# Patient Record
Sex: Female | Born: 1960 | Race: Black or African American | Hispanic: No | Marital: Single | Smoking: Never smoker
Health system: Southern US, Community
[De-identification: ages and names within clinical notes are randomized; demographics above are authoritative.]

## PROBLEM LIST (undated history)

## (undated) DIAGNOSIS — M549 Dorsalgia, unspecified: Secondary | ICD-10-CM

## (undated) DIAGNOSIS — E0500 Thyrotoxicosis with diffuse goiter without thyrotoxic crisis or storm: Secondary | ICD-10-CM

## (undated) DIAGNOSIS — M48061 Spinal stenosis, lumbar region without neurogenic claudication: Secondary | ICD-10-CM

## (undated) DIAGNOSIS — Z86718 Personal history of other venous thrombosis and embolism: Secondary | ICD-10-CM

## (undated) DIAGNOSIS — M5030 Other cervical disc degeneration, unspecified cervical region: Secondary | ICD-10-CM

## (undated) DIAGNOSIS — G373 Acute transverse myelitis in demyelinating disease of central nervous system: Secondary | ICD-10-CM

## (undated) HISTORY — DX: Other cervical disc degeneration, unspecified cervical region: M50.30

## (undated) HISTORY — DX: Dorsalgia, unspecified: M54.9

## (undated) HISTORY — DX: Spinal stenosis, lumbar region without neurogenic claudication: M48.061

## (undated) HISTORY — DX: Acute transverse myelitis in demyelinating disease of central nervous system: G37.3

## (undated) HISTORY — DX: Personal history of other venous thrombosis and embolism: Z86.718

## (undated) HISTORY — PX: OTHER SURGICAL HISTORY: SHX169

---

## 2000-11-19 ENCOUNTER — Other Ambulatory Visit: Admission: RE | Admit: 2000-11-19 | Discharge: 2000-11-19 | Payer: Self-pay | Admitting: Obstetrics and Gynecology

## 2011-07-13 ENCOUNTER — Ambulatory Visit
Admission: RE | Admit: 2011-07-13 | Discharge: 2011-07-13 | Disposition: A | Discharge status: Home / Self Care | Payer: 59 | Source: Ambulatory Visit | Attending: Family Medicine | Admitting: Family Medicine

## 2011-07-13 ENCOUNTER — Other Ambulatory Visit: Payer: Self-pay | Admitting: Family Medicine

## 2011-07-13 DIAGNOSIS — R202 Paresthesia of skin: Secondary | ICD-10-CM

## 2011-07-13 DIAGNOSIS — M79609 Pain in unspecified limb: Secondary | ICD-10-CM

## 2012-05-22 DIAGNOSIS — Z86718 Personal history of other venous thrombosis and embolism: Secondary | ICD-10-CM

## 2012-05-22 HISTORY — DX: Personal history of other venous thrombosis and embolism: Z86.718

## 2012-07-31 ENCOUNTER — Encounter (HOSPITAL_COMMUNITY): Payer: Self-pay | Admitting: *Deleted

## 2012-07-31 ENCOUNTER — Emergency Department (HOSPITAL_COMMUNITY): Payer: 59

## 2012-07-31 ENCOUNTER — Inpatient Hospital Stay (HOSPITAL_COMMUNITY)
Admission: EM | Admit: 2012-07-31 | Discharge: 2012-08-06 | DRG: 176 | Disposition: A | Discharge status: Home / Self Care | Payer: 59 | Attending: Internal Medicine | Admitting: Internal Medicine

## 2012-07-31 DIAGNOSIS — E876 Hypokalemia: Secondary | ICD-10-CM | POA: Diagnosis present

## 2012-07-31 DIAGNOSIS — E039 Hypothyroidism, unspecified: Secondary | ICD-10-CM | POA: Diagnosis present

## 2012-07-31 DIAGNOSIS — E0500 Thyrotoxicosis with diffuse goiter without thyrotoxic crisis or storm: Secondary | ICD-10-CM | POA: Diagnosis present

## 2012-07-31 DIAGNOSIS — D649 Anemia, unspecified: Secondary | ICD-10-CM | POA: Diagnosis not present

## 2012-07-31 DIAGNOSIS — I2699 Other pulmonary embolism without acute cor pulmonale: Principal | ICD-10-CM | POA: Diagnosis present

## 2012-07-31 DIAGNOSIS — R079 Chest pain, unspecified: Secondary | ICD-10-CM | POA: Diagnosis present

## 2012-07-31 DIAGNOSIS — Z79899 Other long term (current) drug therapy: Secondary | ICD-10-CM

## 2012-07-31 HISTORY — DX: Thyrotoxicosis with diffuse goiter without thyrotoxic crisis or storm: E05.00

## 2012-07-31 LAB — CBC WITH DIFFERENTIAL/PLATELET
Basophils Absolute: 0 10*3/uL (ref 0.0–0.1)
Eosinophils Relative: 0 % (ref 0–5)
HCT: 36.3 % (ref 36.0–46.0)
Hemoglobin: 12.8 g/dL (ref 12.0–15.0)
Lymphocytes Relative: 10 % — ABNORMAL LOW (ref 12–46)
MCHC: 35.3 g/dL (ref 30.0–36.0)
MCV: 85.4 fL (ref 78.0–100.0)
Monocytes Absolute: 0.8 10*3/uL (ref 0.1–1.0)
Monocytes Relative: 7 % (ref 3–12)
Neutro Abs: 9.2 10*3/uL — ABNORMAL HIGH (ref 1.7–7.7)
RDW: 12.9 % (ref 11.5–15.5)
WBC: 11.1 10*3/uL — ABNORMAL HIGH (ref 4.0–10.5)

## 2012-07-31 LAB — POCT I-STAT TROPONIN I: Troponin i, poc: 0 ng/mL (ref 0.00–0.08)

## 2012-07-31 LAB — COMPREHENSIVE METABOLIC PANEL
BUN: 11 mg/dL (ref 6–23)
CO2: 28 mEq/L (ref 19–32)
Calcium: 9.6 mg/dL (ref 8.4–10.5)
Chloride: 102 mEq/L (ref 96–112)
Creatinine, Ser: 0.61 mg/dL (ref 0.50–1.10)
GFR calc Af Amer: >90 mL/min (ref >90)
GFR calc non Af Amer: >90 mL/min (ref >90)
Total Bilirubin: 0.6 mg/dL (ref 0.3–1.2)

## 2012-07-31 LAB — URINALYSIS, ROUTINE W REFLEX MICROSCOPIC
Nitrite: NEGATIVE
Protein, ur: NEGATIVE mg/dL
Specific Gravity, Urine: 1.02 (ref 1.005–1.030)
Urobilinogen, UA: 0.2 mg/dL (ref 0.0–1.0)

## 2012-07-31 LAB — URINE MICROSCOPIC-ADD ON

## 2012-07-31 MED ORDER — MORPHINE SULFATE 4 MG/ML IJ SOLN
4.0000 mg | INTRAMUSCULAR | Status: DC | PRN
  Administered 2012-07-31 – 2012-08-01 (×2): 4 mg via INTRAVENOUS
  Filled 2012-07-31 (×3): qty 1

## 2012-07-31 MED ORDER — POTASSIUM CHLORIDE CRYS ER 20 MEQ PO TBCR
40.0000 meq | EXTENDED_RELEASE_TABLET | Freq: Once | ORAL | Status: AC
  Administered 2012-07-31: 40 meq via ORAL
  Filled 2012-07-31: qty 2

## 2012-07-31 MED ORDER — IOHEXOL 350 MG/ML SOLN
100.0000 mL | Freq: Once | INTRAVENOUS | Status: AC | PRN
  Administered 2012-07-31: 100 mL via INTRAVENOUS

## 2012-07-31 MED ORDER — ONDANSETRON HCL 4 MG/2ML IJ SOLN
4.0000 mg | Freq: Once | INTRAMUSCULAR | Status: AC
  Administered 2012-07-31: 4 mg via INTRAVENOUS
  Filled 2012-07-31: qty 2

## 2012-07-31 NOTE — ED Notes (Signed)
Pt reports having (R) sided lower back pain today.  States that pain increases with movement and deep inspiration. Pt denies fall or injury.

## 2012-07-31 NOTE — ED Provider Notes (Signed)
Medical screening examination/treatment/procedure(s) were conducted as a shared visit with non-physician practitioner(s) and myself.  I personally evaluated the patient during the encounter.  Patient seen for right sided back pain, chest pain and shortness of breath. Pain is pleuritic in nature. Patient is not hypoxic, but is clearly tachypneic. PE was considered in the differential diagnosis. CT angiography did confirm right-sided PE that explains the patient's symptoms. Patient will be admitted for further management.  Gilda Crease, MD 07/31/12 2337

## 2012-07-31 NOTE — Progress Notes (Signed)
ANTICOAGULATION CONSULT NOTE - Initial Consult  Pharmacy Consult for Heparin Indication: pulmonary embolus  No Known Allergies  Patient Measurements: Height: 5\' 6"  (167.6 cm) Weight: 201 lb (91.173 kg) IBW/kg (Calculated) : 59.3  Vital Signs: Temp: 97.6 F (36.4 C) (03/12 1910) BP: 143/86 mmHg (03/12 2035) Pulse Rate: 91 (03/12 2035)  Labs:  Recent Labs  07/31/12 2108  HGB 12.8  HCT 36.3  PLT 195  CREATININE 0.61    Estimated Creatinine Clearance: 92.6 ml/min (by C-G formula based on Cr of 0.61).   Medical History: Past Medical History  Diagnosis Date  . Graves disease     Medications:  Pepcid  Synthroid  Naproxen  Pen VK  Assessment: 52 yo female with PE for heparin  Goal of Therapy:  Heparin level 0.3-0.7 units/ml Monitor platelets by anticoagulation protocol: Yes   Plan:  Heparin 4000 units IV bolus, then 1300 units/hr Check heparin level in 6 hours.   Eddie Candle 08/01/2012,12:01 AM

## 2012-07-31 NOTE — ED Notes (Addendum)
Pt states that last night she started experiencing pain in her back that's exacerbated with breathing/coughing.  About 3 weeks ago she had a bladder infection and took 2 rounds of antibiotic.

## 2012-07-31 NOTE — ED Provider Notes (Signed)
History    This chart was scribed for non-physician practitioner working with Cynthia Villegas, * by ED Scribe, Burman Nieves. This patient was seen in room TR05C/TR05C and the patient's care was started at 8:22 PM.  CSN: [id number]  Arrival date & time 07/31/12  1858   First MD Initiated Contact with Patient 07/31/12 2022      Chief Complaint  Patient presents with  . Back Pain    (Consider location/radiation/quality/duration/timing/severity/associated sxs/prior treatment) Patient is a 52 y.o. female presenting with back pain. The history is provided by the patient and medical records. No language interpreter was used.  Back Pain Associated symptoms: chest pain   Associated symptoms: no abdominal pain, no dysuria, no fever and no headaches    Cynthia Villegas is a 52 y.o. female brought in by co-worker who presents to the Emergency Department complaining of moderate constant right side flank pain onset today.  Pt was seen for a bladder infection recently and was given antibiotics but denied any dysuria or frequency. The chest pain's onset was 3 days ago shortly after the bladder infection. She reports the chest pain is exacerbated when she breaths. Pt complains that yesterday the chest pain was unbearable.  Pt reports she is shallow breathing to prevent chest pain.Pt was sitting at her desk working today when the right sided flank pain began.  She states that the flank pain and chest pain act simultaneously with one another especially when she breaths. Pt has associated nausea as well with right sided flank pain.  Before today pt states that the pain was a dull sensation.  Pt has not urinated with flank pain. Pt denies any bowel incontinence, fever, chills, cough, vomiting, diarrhea, SOB, weakness, and any other associated symptoms. She denies any medical hx of kidney stones. Reaching down exacerbates lower back pain. Pt denies any fall or injury. X rays or blood work were not taken at her  last visit for treatment of bladder infection.     Past Medical History  Diagnosis Date  . Graves disease     History reviewed. No pertinent past surgical history.  Family History  Problem Relation Age of Onset  . Breast cancer Mother   . Heart disease Father     History  Substance Use Topics  . Smoking status: Never Smoker   . Smokeless tobacco: Not on file  . Alcohol Use: Yes     Comment: occasional    OB History   Grav Para Term Preterm Abortions TAB SAB Ect Mult Living                  Review of Systems  Constitutional: Negative for fever, diaphoresis, appetite change, fatigue and unexpected weight change.  HENT: Negative for mouth sores and neck stiffness.   Eyes: Negative for visual disturbance.  Respiratory: Negative for cough, chest tightness, shortness of breath and wheezing.   Cardiovascular: Positive for chest pain.  Gastrointestinal: Negative for nausea, vomiting, abdominal pain, diarrhea and constipation.  Endocrine: Negative for polydipsia, polyphagia and polyuria.  Genitourinary: Negative for dysuria, urgency, frequency and hematuria.  Musculoskeletal: Positive for myalgias and back pain.  Skin: Negative for rash.  Allergic/Immunologic: Negative for immunocompromised state.  Neurological: Negative for syncope, light-headedness and headaches.  Hematological: Does not bruise/bleed easily.  Psychiatric/Behavioral: Negative for sleep disturbance. The patient is not nervous/anxious.   All other systems reviewed and are negative.    Allergies  Review of patient's allergies indicates no known allergies.  Home Medications  Current Outpatient Rx  Name  Route  Sig  Dispense  Refill  . famotidine-calcium carbonate-magnesium hydroxide (PEPCID COMPLETE) [phone] MG CHEW   Oral   Chew 1 tablet by mouth 2 (two) times daily.         Marland Kitchen levothyroxine (SYNTHROID, LEVOTHROID) 137 MCG tablet   Oral   Take 137 mcg by mouth daily.         . naproxen  (NAPROSYN) 500 MG tablet   Oral   Take 500 mg by mouth 2 (two) times daily with a meal.         . penicillin v potassium (VEETID) 500 MG tablet   Oral   Take 500 mg by mouth 3 (three) times daily. Started 3.3.14 for 7 days         . warfarin (COUMADIN) 5 MG tablet   Oral   Take 1 tablet (5 mg total) by mouth daily.   30 tablet   2     BP 164/89  Pulse 95  Temp(Src) 97.6 F (36.4 C)  Resp 18  SpO2 100%  Physical Exam  Nursing note and vitals reviewed. Constitutional: She is oriented to person, place, and time. She appears well-developed and well-nourished. No distress.  HENT:  Head: Normocephalic and atraumatic.  Mouth/Throat: Oropharynx is clear and moist. No oropharyngeal exudate.  Eyes: Conjunctivae and EOM are normal. Pupils are equal, round, and reactive to light.  Neck: Normal range of motion. Neck supple.  Full ROM without pain  Cardiovascular: Normal rate, regular rhythm, normal heart sounds and intact distal pulses.  Exam reveals no gallop and no friction rub.   No murmur heard. Pt not tachycardic  Pulmonary/Chest: Breath sounds normal. No accessory muscle usage. Tachypnea noted. No respiratory distress. She has no decreased breath sounds. She has no wheezes. She has no rhonchi. She has no rales.   She exhibits tenderness (posterior right ribs, very mild).  Abdominal: Soft. Normal appearance and bowel sounds are normal. She exhibits no distension. There is no tenderness. There is no rebound, no guarding and no CVA tenderness.  Musculoskeletal: Normal range of motion. She exhibits no edema and no tenderness.  Full range of motion of the T-spine and L-spine No tenderness to palpation of the spinous processes of the T-spine or L-spine  Lymphadenopathy:    She has no cervical adenopathy.  Neurological: She is alert and oriented to person, place, and time. She has normal reflexes. She exhibits normal muscle tone. Coordination normal.  Speech is clear and goal  oriented, follows commands Normal strength in upper and lower extremities bilaterally including dorsiflexion and plantar flexion, strong and equal grip strength Sensation normal to light and sharp touch Moves extremities without ataxia, coordination intact Normal gait Normal balance   Skin: Skin is warm and dry. No rash noted. She is not diaphoretic. No erythema.  Psychiatric: She has a normal mood and affect.    ED Course  Procedures (including critical care time) DIAGNOSTIC STUDIES: Oxygen Saturation is 100% on room air , normal by my interpretation.    COORDINATION OF CARE:  8:34 PM Discussed ED treatment with pt and pt agrees.    Labs Reviewed  CBC WITH DIFFERENTIAL - Abnormal; Notable for the following:    WBC 11.1 (*)    Neutrophils Relative 82 (*)    Neutro Abs 9.2 (*)    Lymphocytes Relative 10 (*)    All other components within normal limits  COMPREHENSIVE METABOLIC PANEL - Abnormal; Notable for the following:  Potassium 3.0 (*)    Glucose, Bld 117 (*)    Total Protein 8.5 (*)    AST 58 (*)    ALT 52 (*)    All other components within normal limits  URINALYSIS, ROUTINE W REFLEX MICROSCOPIC - Abnormal; Notable for the following:    APPearance HAZY (*)    Hgb urine dipstick MODERATE (*)    Ketones, ur 40 (*)    Leukocytes, UA SMALL (*)    All other components within normal limits  PROTEIN C, TOTAL - Abnormal; Notable for the following:    Protein C, Total 54 (*)    All other components within normal limits  PROTEIN S ACTIVITY - Abnormal; Notable for the following:    Protein S Activity 51 (*)    All other components within normal limits  PROTEIN S, TOTAL - Abnormal; Notable for the following:    Protein S Ag, Total 57 (*)    All other components within normal limits  CARDIOLIPIN ANTIBODIES, IGG, IGM, IGA - Abnormal; Notable for the following:    Anticardiolipin IgG 9 (*)    Anticardiolipin IgM 0 (*)    Anticardiolipin IgA 6 (*)    All other components  within normal limits  COMPREHENSIVE METABOLIC PANEL - Abnormal; Notable for the following:    Potassium 3.4 (*)    Glucose, Bld 112 (*)    Albumin 3.2 (*)    ALT 38 (*)    All other components within normal limits  CBC - Abnormal; Notable for the following:    RBC 3.75 (*)    Hemoglobin 11.3 (*)    HCT 32.3 (*)    All other components within normal limits  URINALYSIS, ROUTINE W REFLEX MICROSCOPIC - Abnormal; Notable for the following:    Hgb urine dipstick MODERATE (*)    Ketones, ur 15 (*)    Leukocytes, UA SMALL (*)    All other components within normal limits  HEPARIN LEVEL (UNFRACTIONATED) - Abnormal; Notable for the following:    Heparin Unfractionated 0.28 (*)    All other components within normal limits  CBC - Abnormal; Notable for the following:    RBC 3.56 (*)    Hemoglobin 10.6 (*)    HCT 31.1 (*)    All other components within normal limits  BASIC METABOLIC PANEL - Abnormal; Notable for the following:    Potassium 3.3 (*)    All other components within normal limits  URINE MICROSCOPIC-ADD ON - Abnormal; Notable for the following:    Squamous Epithelial / LPF FEW (*)    All other components within normal limits  CBC - Abnormal; Notable for the following:    RBC 3.72 (*)    Hemoglobin 11.2 (*)    HCT 32.2 (*)    All other components within normal limits  PROTIME-INR - Abnormal; Notable for the following:    Prothrombin Time 15.5 (*)    All other components within normal limits  HEPARIN LEVEL (UNFRACTIONATED) - Abnormal; Notable for the following:    Heparin Unfractionated 0.29 (*)    All other components within normal limits  CBC - Abnormal; Notable for the following:    RBC 3.69 (*)    Hemoglobin 11.2 (*)    HCT 31.7 (*)    All other components within normal limits  PROTIME-INR - Abnormal; Notable for the following:    Prothrombin Time 17.5 (*)    All other components within normal limits  CBC - Abnormal; Notable for the following:  HCT 35.5 (*)    All  other components within normal limits  PROTIME-INR - Abnormal; Notable for the following:    Prothrombin Time 18.7 (*)    INR 1.62 (*)    All other components within normal limits  CBC - Abnormal; Notable for the following:    HCT 34.5 (*)    All other components within normal limits  PROTIME-INR - Abnormal; Notable for the following:    Prothrombin Time 23.8 (*)    INR 2.24 (*)    All other components within normal limits  URINE CULTURE  URINE MICROSCOPIC-ADD ON  LIPASE, BLOOD  ANTITHROMBIN III  PROTEIN C ACTIVITY  LUPUS ANTICOAGULANT PANEL  BETA-2-GLYCOPROTEIN I ABS, IGG/M/A  HOMOCYSTEINE  FACTOR 5 LEIDEN  HEPARIN LEVEL (UNFRACTIONATED)  MAGNESIUM  PHOSPHORUS  TSH  PROTIME-INR  HEPARIN LEVEL (UNFRACTIONATED)  PROTIME-INR  HEPARIN LEVEL (UNFRACTIONATED)  HEPARIN LEVEL (UNFRACTIONATED)  BASIC METABOLIC PANEL  TROPONIN I  TROPONIN I  TROPONIN I  HEPARIN LEVEL (UNFRACTIONATED)  HEPARIN LEVEL (UNFRACTIONATED)  HEPARIN LEVEL (UNFRACTIONATED)  POCT I-STAT TROPONIN I   No results found.  ECG:  Date: 08/13/2012  Rate: 110  Rhythm: sinus tachycardia  QRS Axis: left  Intervals: normal  ST/T Wave abnormalities: normal  Conduction Disutrbances:none  Narrative Interpretation: sinus tach without evidence of ischemia; no old for comparison  Old EKG Reviewed: none available    1. Pulmonary embolism   2. Graves disease   3. Hypokalemia   4. Anemia   5. Unspecified hypothyroidism   6. Chest pain       MDM  Cynthia Villegas presents with back and flank pain.  Patient evaluated yesterday for chest pain without blood work or further evaluation and diagnosed with costochondritis. Patient alert, oriented in mild distress in triage. Patient tachypneic, though not tachycardic or hypoxic. Movement makes her pain worse and deep inspiration makes her pain significantly worse. Patient presentation is concerning for possible PE. Will obtain labs and CT angio of chest. I discussed  the patient with Dr. Blinda Leatherwood who will also follow.  ECG now with sinus tach.  CT angiography did confirm right-sided PE that explains the patient's symptoms. Patient will be admitted for further management. I personally reviewed the imaging tests through PACS system.  I reviewed available ER/hospitalization records through the EMR.   I personally performed the services described in this documentation, which was scribed in my presence. The recorded information has been reviewed and is accurate.   Dahlia Client Muthersbaugh, PA-C 08/13/12 [phone]

## 2012-08-01 ENCOUNTER — Encounter (HOSPITAL_COMMUNITY): Payer: Self-pay | Admitting: Internal Medicine

## 2012-08-01 DIAGNOSIS — E039 Hypothyroidism, unspecified: Secondary | ICD-10-CM | POA: Diagnosis present

## 2012-08-01 DIAGNOSIS — D649 Anemia, unspecified: Secondary | ICD-10-CM | POA: Diagnosis present

## 2012-08-01 DIAGNOSIS — E876 Hypokalemia: Secondary | ICD-10-CM | POA: Diagnosis present

## 2012-08-01 DIAGNOSIS — I2699 Other pulmonary embolism without acute cor pulmonale: Secondary | ICD-10-CM

## 2012-08-01 DIAGNOSIS — E0500 Thyrotoxicosis with diffuse goiter without thyrotoxic crisis or storm: Secondary | ICD-10-CM | POA: Diagnosis present

## 2012-08-01 LAB — URINALYSIS, ROUTINE W REFLEX MICROSCOPIC
Bilirubin Urine: NEGATIVE
Ketones, ur: 15 mg/dL — AB
Nitrite: NEGATIVE
pH: 6 (ref 5.0–8.0)

## 2012-08-01 LAB — COMPREHENSIVE METABOLIC PANEL
Alkaline Phosphatase: 80 U/L (ref 39–117)
BUN: 9 mg/dL (ref 6–23)
Calcium: 8.7 mg/dL (ref 8.4–10.5)
GFR calc Af Amer: >90 mL/min (ref >90)
Glucose, Bld: 112 mg/dL — ABNORMAL HIGH (ref 70–99)
Total Protein: 7 g/dL (ref 6.0–8.3)

## 2012-08-01 LAB — ANTITHROMBIN III: AntiThromb III Func: 95 % (ref 75–120)

## 2012-08-01 LAB — BETA-2-GLYCOPROTEIN I ABS, IGG/M/A
Beta-2 Glyco I IgG: 0 G Units (ref <20)
Beta-2-Glycoprotein I IgM: 8 M Units (ref <20)

## 2012-08-01 LAB — CARDIOLIPIN ANTIBODIES, IGG, IGM, IGA: Anticardiolipin IgA: 6 APL U/mL — ABNORMAL LOW (ref <22)

## 2012-08-01 LAB — URINE MICROSCOPIC-ADD ON

## 2012-08-01 LAB — CBC
HCT: 32.3 % — ABNORMAL LOW (ref 36.0–46.0)
Hemoglobin: 11.3 g/dL — ABNORMAL LOW (ref 12.0–15.0)
RDW: 13 % (ref 11.5–15.5)
WBC: 10 10*3/uL (ref 4.0–10.5)

## 2012-08-01 LAB — HOMOCYSTEINE: Homocysteine: 7.8 umol/L (ref 4.0–15.4)

## 2012-08-01 LAB — MAGNESIUM: Magnesium: 1.8 mg/dL (ref 1.5–2.5)

## 2012-08-01 LAB — PHOSPHORUS: Phosphorus: 3.6 mg/dL (ref 2.3–4.6)

## 2012-08-01 LAB — HEPARIN LEVEL (UNFRACTIONATED): Heparin Unfractionated: 0.39 IU/mL (ref 0.30–0.70)

## 2012-08-01 MED ORDER — POTASSIUM CHLORIDE CRYS ER 20 MEQ PO TBCR
40.0000 meq | EXTENDED_RELEASE_TABLET | Freq: Once | ORAL | Status: AC
  Administered 2012-08-01: 40 meq via ORAL
  Filled 2012-08-01: qty 2

## 2012-08-01 MED ORDER — ONDANSETRON HCL 4 MG/2ML IJ SOLN
4.0000 mg | Freq: Four times a day (QID) | INTRAMUSCULAR | Status: DC | PRN
  Administered 2012-08-02: 4 mg via INTRAVENOUS
  Filled 2012-08-01: qty 2

## 2012-08-01 MED ORDER — LEVOTHYROXINE SODIUM 137 MCG PO TABS
137.0000 ug | ORAL_TABLET | Freq: Every day | ORAL | Status: DC
  Administered 2012-08-01 – 2012-08-06 (×6): 137 ug via ORAL
  Filled 2012-08-01 (×8): qty 1

## 2012-08-01 MED ORDER — HYDROCODONE-ACETAMINOPHEN 5-325 MG PO TABS
1.0000 | ORAL_TABLET | ORAL | Status: DC | PRN
  Administered 2012-08-01 (×3): 2 via ORAL
  Administered 2012-08-01: 1 via ORAL
  Administered 2012-08-02 – 2012-08-04 (×6): 2 via ORAL
  Administered 2012-08-04: 1 via ORAL
  Filled 2012-08-01 (×5): qty 2
  Filled 2012-08-01: qty 1
  Filled 2012-08-01: qty 2
  Filled 2012-08-01: qty 1
  Filled 2012-08-01 (×2): qty 2
  Filled 2012-08-01 (×2): qty 1
  Filled 2012-08-01 (×2): qty 2

## 2012-08-01 MED ORDER — SODIUM CHLORIDE 0.9 % IJ SOLN
3.0000 mL | Freq: Two times a day (BID) | INTRAMUSCULAR | Status: DC
  Administered 2012-08-03: 3 mL via INTRAVENOUS

## 2012-08-01 MED ORDER — HEPARIN BOLUS VIA INFUSION
4000.0000 [IU] | Freq: Once | INTRAVENOUS | Status: AC
  Administered 2012-08-01: 4000 [IU] via INTRAVENOUS

## 2012-08-01 MED ORDER — POTASSIUM CHLORIDE CRYS ER 20 MEQ PO TBCR
20.0000 meq | EXTENDED_RELEASE_TABLET | Freq: Once | ORAL | Status: AC
  Administered 2012-08-01: 20 meq via ORAL
  Filled 2012-08-01: qty 1

## 2012-08-01 MED ORDER — ACETAMINOPHEN 650 MG RE SUPP: 650.0000 mg | Freq: Four times a day (QID) | RECTAL | Status: DC | PRN

## 2012-08-01 MED ORDER — LEVOTHYROXINE SODIUM 50 MCG PO TABS
50.0000 ug | ORAL_TABLET | Freq: Every day | ORAL | Status: DC
  Filled 2012-08-01: qty 1

## 2012-08-01 MED ORDER — ONDANSETRON HCL 4 MG PO TABS: 4.0000 mg | ORAL_TABLET | Freq: Four times a day (QID) | ORAL | Status: DC | PRN

## 2012-08-01 MED ORDER — DOCUSATE SODIUM 100 MG PO CAPS
100.0000 mg | ORAL_CAPSULE | Freq: Two times a day (BID) | ORAL | Status: DC
  Administered 2012-08-03 – 2012-08-06 (×7): 100 mg via ORAL
  Filled 2012-08-01 (×12): qty 1

## 2012-08-01 MED ORDER — HEPARIN (PORCINE) IN NACL 100-0.45 UNIT/ML-% IJ SOLN
1500.0000 [IU]/h | INTRAMUSCULAR | Status: DC
  Administered 2012-08-01 (×2): 1300 [IU]/h via INTRAVENOUS
  Administered 2012-08-02 – 2012-08-04 (×3): 1400 [IU]/h via INTRAVENOUS
  Administered 2012-08-04 – 2012-08-06 (×3): 1500 [IU]/h via INTRAVENOUS
  Filled 2012-08-01 (×9): qty 250

## 2012-08-01 MED ORDER — SODIUM CHLORIDE 0.9 % IV SOLN: INTRAVENOUS | Status: AC

## 2012-08-01 MED ORDER — WARFARIN - PHARMACIST DOSING INPATIENT
Freq: Every day | Status: DC
  Administered 2012-08-02: 18:00:00

## 2012-08-01 MED ORDER — COUMADIN BOOK
Freq: Once | Status: AC
  Administered 2012-08-01: 09:00:00
  Filled 2012-08-01: qty 1

## 2012-08-01 MED ORDER — ACETAMINOPHEN 325 MG PO TABS
650.0000 mg | ORAL_TABLET | Freq: Four times a day (QID) | ORAL | Status: DC | PRN
  Administered 2012-08-01: 650 mg via ORAL
  Filled 2012-08-01: qty 2

## 2012-08-01 MED ORDER — WARFARIN SODIUM 7.5 MG PO TABS
7.5000 mg | ORAL_TABLET | Freq: Once | ORAL | Status: AC
  Administered 2012-08-01: 7.5 mg via ORAL
  Filled 2012-08-01: qty 1

## 2012-08-01 MED ORDER — WARFARIN VIDEO
Freq: Once | Status: AC
  Administered 2012-08-01: 09:00:00

## 2012-08-01 NOTE — Progress Notes (Addendum)
[facility] PROGRESS NOTE  Cynthia Villegas WJX:[id number] DOB: 07-Nov-1960 DOA: 07/31/2012 PCP: Cain Saupe, MD  Brief narrative: Addendum to admission note done 08/01/2012 52 year old female with a significant past medical history of Graves' disease and subsequent hypothyroidism who presented to [facility] ED 07/31/12 with chest pain associated with respirations. In ED, evaluation included CT angiogram chest which was significant for acute pulmonary embolism within a right lower lobe segmental branch. Patient was started on Coumadin and heparin. Further evaluation included LE doppler which was negative for DVT.  Assessment/Plan:  Principal Problem:   *PE (pulmonary embolism)  Unclear etiology, hypercoagulable state workup is pending  CT chest angio chest confirmed pulmonary embolism in right lower lobe segmental branch.  LE doppler negative for DVT Active Problems:   Hypokalemia  Repleted  Followup BMP in the morning   Unspecified hypothyroidism  Continue levothyroxine.  Follow up TSH.   Anemia  Hemoglobin was within normal limits on admission and 11.3 today.  Continue to monitor CBC considering patient is on heparin and Coumadin.  Code Status: Full code Family Communication: Family at bedside Disposition Plan: Home when stable  Manson Passey, MD  [facility] Pager [phone]  If 7PM-7AM, please contact night-coverage [email or web address] Password [email or web address] 08/01/2012, 1:22 PM   LOS: 1 day   Consultants:  None   Procedures:  None   Antibiotics:  None   HPI/Subjective: No acute overnight events.  Objective: Filed Vitals:   07/31/12 2324 08/01/12 0030 08/01/12 0145 08/01/12 0548  BP:  136/75 132/73 105/65  Pulse:  96 102 90  Temp:   98 F (36.7 C) 98.1 F (36.7 C)  TempSrc:   Oral Oral  Resp:  22 20 20   Height: 5\' 6"  (1.676 m)     Weight: 91.173 kg (201 lb)  91.1 kg (200 lb 13.4 oz)   SpO2:  100% 94% 96%    Intake/Output Summary (Last 24 hours) at 08/01/12  1322 Last data filed at 08/01/12 0800  Gross per 24 hour  Intake    240 ml  Output      0 ml  Net    240 ml    Exam:   General:  Pt is alert, follows commands appropriately, not in acute distress  Cardiovascular: Regular rate and rhythm, S1/S2, no murmurs, no rubs, no gallops  Respiratory: Clear to auscultation bilaterally, no wheezing, no crackles, no rhonchi  Abdomen: Soft, non tender, non distended, bowel sounds present, no guarding  Extremities: No edema, pulses DP and PT palpable bilaterally  Neuro: Grossly nonfocal  Data Reviewed: Basic Metabolic Panel:  Recent Labs Lab 07/31/12 2108 08/01/12 0620  NA 143 141  K 3.0* 3.4*  CL 102 105  CO2 28 27  GLUCOSE 117* 112*  BUN 11 9  CREATININE 0.61 0.62  CALCIUM 9.6 8.7  MG  --  1.8  PHOS  --  3.6   Liver Function Tests:  Recent Labs Lab 07/31/12 2108 08/01/12 0620  AST 58* 32  ALT 52* 38*  ALKPHOS 94 80  BILITOT 0.6 0.6  PROT 8.5* 7.0  ALBUMIN 4.1 3.2*    Recent Labs Lab 07/31/12 2108  LIPASE 24   No results found for this basename: AMMONIA,  in the last 168 hours CBC:  Recent Labs Lab 07/31/12 2108 08/01/12 0620  WBC 11.1* 10.0  NEUTROABS 9.2*  --   HGB 12.8 11.3*  HCT 36.3 32.3*  MCV 85.4 86.1  PLT 195 166    Studies: Dg Chest 2 View  07/31/2012 IMPRESSION: Low inspiratory volumes with bibasilar atelectasis versus scarring.   Original Report Authenticated By: Malachy Moan, M.D.    Ct Angio Chest Pe W/cm &/or Wo Cm 07/31/2012  *  IMPRESSION: Acute pulmonary embolism within a right lower lobe segmental branch.  Patchy airspace opacities within the lower lobes, middle lobe, and lingula.  May reflect atelectasis or infection.  Infarction not entirely exclude however felt to be less likely.  Critical Value/emergent results were called by telephone at the time of interpretation on 07/31/2012 at 11:15 p.m. to [facility], who verbally acknowledged these results.   Original Report  Authenticated By: Jearld Lesch, M.D.     Scheduled Meds: . docusate sodium  100 mg Oral BID  . sodium chloride  3 mL Intravenous Q12H  . warfarin  7.5 mg Oral ONCE-1800  . Warfarin - Pharmacist Dosing Inpatient   Does not apply q1800   Continuous Infusions: . heparin 1,300 Units/hr (08/01/12 0017)

## 2012-08-01 NOTE — Progress Notes (Signed)
Bilateral:  No evidence of DVT, superficial thrombosis, or Baker's Cyst.   

## 2012-08-01 NOTE — H&P (Signed)
PCP:  Cain Saupe, MD  Endocrine: Talmage Nap  Chief Complaint:   Chest pain  HPI: Cynthia Villegas is a 53 y.o. female   has a past medical history of Graves disease.   Presented with  3 weeks ago she developed nausea and vomiting and pain under both breast which was worse with inspiration. She went to PCP and her urine sowed evidence of UTI. She was started on cipro and then switched to Penicilin K she was told she was not on the right antibiotic.There  is no urine culture results available. She continued to have intermittent chest pain and thought to start on Prilosec without much relief. Today she was at work and developed light-headedness and severe pain with inspiration she came to ER and was diagnosed with subsegmental  PE. Hypercoagulable panel was ordered and she was started on  Heparin.  She deines any leg swelling, no travelling history. She sits at her computer during work day.  Her mother has hx of Breast cancer diagnosed in her 32's and had hx of DVT in the setting of knee surgery. Patient have had a mammogram a year ago but have not had a recent colonoscopy.    Review of Systems:    Pertinent positives include: chest pain pleuretic,  shortness of breath at rest.  Constitutional:  No weight loss, night sweats, Fevers, chills, fatigue, weight loss  HEENT:  No headaches, Difficulty swallowing,Tooth/dental problems,Sore throat,  No sneezing, itching, ear ache, nasal congestion, post nasal drip,  Cardio-vascular:  No, Orthopnea, PND, anasarca, dizziness, palpitations.no Bilateral lower extremity swelling  GI:  No heartburn, indigestion, abdominal pain, nausea, vomiting, diarrhea, change in bowel habits, loss of appetite, melena, blood in stool, hematemesis Resp:  no dyspnea on exertion, No excess mucus, no productive cough, No non-productive cough, No coughing up of blood.No change in color of mucus.No wheezing. Skin:  no rash or lesions. No jaundice GU:  no dysuria, change  in color of urine, no urgency or frequency. No straining to urinate.  No flank pain.  Musculoskeletal:  No joint pain or no joint swelling. No decreased range of motion. No back pain.  Psych:  No change in mood or affect. No depression or anxiety. No memory loss.  Neuro: no localizing neurological complaints, no tingling, no weakness, no double vision, no gait abnormality, no slurred speech, no confusion  Otherwise ROS are negative except for above, 10 systems were reviewed  Past Medical History: Past Medical History  Diagnosis Date  . Graves disease    History reviewed. No pertinent past surgical history.   Medications: Prior to Admission medications   Medication Sig Start Date End Date Taking? Authorizing Provider  famotidine-calcium carbonate-magnesium hydroxide (PEPCID COMPLETE) [phone] MG CHEW Chew 1 tablet by mouth 2 (two) times daily.   Yes Historical Provider, MD  Levothyroxine Sodium (LEVOXYL PO) Take 1 tablet by mouth daily.   Yes Historical Provider, MD  naproxen (NAPROSYN) 500 MG tablet Take 500 mg by mouth 2 (two) times daily with a meal.   Yes Historical Provider, MD  penicillin v potassium (VEETID) 500 MG tablet Take 500 mg by mouth 3 (three) times daily. Started 3.3.14 for 7 days   Yes Historical Provider, MD    Allergies:  No Known Allergies  Social History:  Ambulatory  independently Lives at  Home with dogs   reports that she has never smoked. She does not have any smokeless tobacco history on file. She reports that  drinks alcohol. She reports that she  does not use illicit drugs.   Family History: family history includes Breast cancer in her mother and Heart disease in her father.    Physical Exam: Patient Vitals for the past 24 hrs:  BP Temp Pulse Resp SpO2 Height Weight  08/01/12 0030 136/75 mmHg - 96 22 100 % - -  07/31/12 2324 - - - - - 5\' 6"  (1.676 m) 91.173 kg (201 lb)  07/31/12 2035 143/86 mmHg - 91 14 98 % - -  07/31/12 1910 164/89 mmHg  97.6 F (36.4 C) 95 18 100 % - -    1. General:  in No Acute distress 2. Psychological: Alert and  Oriented 3. Head/ENT:   Moist Mucous Membranes                          Head Non traumatic, neck supple                          Normal  Dentition 4. SKIN: normal  Skin turgor,  Skin clean Dry and intact no rash 5. Heart: Regular rate and rhythm no Murmur, Rub or gallop 6. Lungs: Clear to auscultation bilaterally, no wheezes or crackles   7. Abdomen: Soft, non-tender, Non distended 8. Lower extremities: no clubbing, cyanosis, or edema 9. Neurologically Grossly intact, moving all 4 extremities equally 10. MSK: Normal range of motion  body mass index is 32.46 kg/(m^2).   Labs on Admission:   Recent Labs  07/31/12 2108  NA 143  K 3.0*  CL 102  CO2 28  GLUCOSE 117*  BUN 11  CREATININE 0.61  CALCIUM 9.6    Recent Labs  07/31/12 2108  AST 58*  ALT 52*  ALKPHOS 94  BILITOT 0.6  PROT 8.5*  ALBUMIN 4.1    Recent Labs  07/31/12 2108  LIPASE 24    Recent Labs  07/31/12 2108  WBC 11.1*  NEUTROABS 9.2*  HGB 12.8  HCT 36.3  MCV 85.4  PLT 195   No results found for this basename: CKTOTAL, CKMB, CKMBINDEX, TROPONINI,  in the last 72 hours No results found for this basename: TSH, T4TOTAL, FREET3, T3FREE, THYROIDAB,  in the last 72 hours No results found for this basename: VITAMINB12, FOLATE, FERRITIN, TIBC, IRON, RETICCTPCT,  in the last 72 hours No results found for this basename: HGBA1C    Estimated Creatinine Clearance: 92.6 ml/min (by C-G formula based on Cr of 0.61). ABG No results found for this basename: phart, pco2, po2, hco3, tco2, acidbasedef, o2sat     No results found for this basename: DDIMER     Other results:  I have pearsonaly reviewed this: ECG REPORT  Rate:110  Rhythm: ST ST&T Change: S waves in aVF, III and flipped T waves.   UA mild hematouria  Cultures: No results found for this basename: sdes, specrequest, cult, reptstatus        Radiological Exams on Admission: Dg Chest 2 View  07/31/2012  *RADIOLOGY REPORT*  Clinical Data: Chest pain, back pain  CHEST - 2 VIEW  Comparison: None.  Findings: Bibasilar linear opacities consistent with atelectasis versus scarring.  Cardiac and mediastinal contours within normal limits.  Inspiratory volumes are low.  No edema, pleural effusion or pneumothorax.  No acute osseous abnormality.  IMPRESSION: Low inspiratory volumes with bibasilar atelectasis versus scarring.   Original Report Authenticated By: Malachy Moan, M.D.    Ct Angio Chest Pe W/cm &/or Wo Cm  07/31/2012  *RADIOLOGY REPORT*  Clinical Data: Shortness of breath, chest pain.  CT ANGIOGRAPHY CHEST  Technique:  Multidetector CT imaging of the chest using the standard protocol during bolus administration of intravenous contrast. Multiplanar reconstructed images including MIPs were obtained and reviewed to evaluate the vascular anatomy.  Contrast: OMNIPAQUE IOHEXOL 350 MG/ML SOLN  Comparison: 07/31/2012 radiograph  Findings: There is a pulmonary arterial branch filling defect to the right lower lobe (series 6 image 134).  Normal caliber aorta.  Heart size upper normal.  No pleural or pericardial effusion.  Limited images through the upper abdomen show no acute finding.  Central airways are patent.  There are patchy airspace opacities within the lower lobes, lingula, and middle lobe.  No pneumothorax. Calcifications along the right lower lobe are nonspecific.  No acute osseous finding.  IMPRESSION: Acute pulmonary embolism within a right lower lobe segmental branch.  Patchy airspace opacities within the lower lobes, middle lobe, and lingula.  May reflect atelectasis or infection.  Infarction not entirely exclude however felt to be less likely.  Critical Value/emergent results were called by telephone at the time of interpretation on 07/31/2012 at 11:15 p.m. to [facility], who verbally acknowledged these results.   Original  Report Authenticated By: Jearld Lesch, M.D.     Chart has been reviewed  Assessment/Plan  52 year old female with known known risk factors with new diagnosis of pulmonary embolism.  Present on Admission:  . PE (pulmonary embolism) - monitor on telemetry, give IV fluids, heparin IV and Coumadin per pharmacy, hypercoagulable panel already ordered. We'll order 2-D echo to evaluate for right-sided strain as there are EKG changes. The also obtain Dopplers of low extremity bilaterally to evaluate for clot burden. Patient will need to cancer workup done she'll have a colonoscopy done and have him at the UA should be further pursued. Will repeat UA tomorrow and obtain urine culture.  Luiz Blare disease - at this point is unclear what her dose of thyroid replacement hormone at the time of his discharge he should be continued  . Hypokalemia - we'll replace Questionable history of UTI hold off penicillin for now obtain urine culture.   Prophylaxis: heparin  CODE STATUS: FULL CODE  Other plan as per orders.  I have spent a total of 55 min on this admission  DOUTOVA,ANASTASSIA 08/01/2012, 12:44 AM

## 2012-08-01 NOTE — Progress Notes (Signed)
ANTICOAGULATION CONSULT NOTE - Follow Up Consult  Pharmacy Consult for Heparin/Coumadin Indication: RLL PE  No Known Allergies  Patient Measurements: Height: 5\' 6"  (167.6 cm) Weight: 200 lb 13.4 oz (91.1 kg) IBW/kg (Calculated) : 59.3 Heparin Dosing Weight:    Vital Signs: Temp: 98.1 F (36.7 C) (03/13 0548) Temp src: Oral (03/13 0548) BP: 105/65 mmHg (03/13 0548) Pulse Rate: 90 (03/13 0548)  Labs:  Recent Labs  07/31/12 2108 08/01/12 0620 08/01/12 1240  HGB 12.8 11.3*  --   HCT 36.3 32.3*  --   PLT 195 166  --   LABPROT  --  15.2  --   INR  --  1.22  --   HEPARINUNFRC  --  0.50 0.39  CREATININE 0.61 0.62  --     Estimated Creatinine Clearance: 92.4 ml/min (by C-G formula based on Cr of 0.62).  Assessment: Symptoms x 3 weeks presenting with light-headedness and severe pain on inspiration= segmental PE. Heparin level 0.5 in goal range. Hgb down from 12.8 to 11.3. Plts 166. LE doppler negative.  2nd confirmatory heparin level 0.39 remains in goal range.  Goal of Therapy:  Heparin level 0.3-0.7 units/ml INR 2-3 Monitor platelets by anticoagulation protocol: Yes   Plan:  F/u Hypercoagulable panel  F/u 2D Echo with EKG changes Continue heparin at 1300 units/hr. Next HL in am. Coumadin 7.5mg  po x 1 tonight. Initiate Coumadin education with book/video.   Aidden Markovic S. Merilynn Finland, PharmD, BCPS Clinical Staff Pharmacist Pager [phone]   Misty Stanley Stillinger 08/01/2012,1:56 PM

## 2012-08-01 NOTE — Progress Notes (Signed)
ANTICOAGULATION CONSULT NOTE - Follow Up Consult  Pharmacy Consult for Heparin/Coumadin Indication: RLL PE  No Known Allergies  Patient Measurements: Height: 5\' 6"  (167.6 cm) Weight: 200 lb 13.4 oz (91.1 kg) IBW/kg (Calculated) : 59.3 Heparin Dosing Weight:    Vital Signs: Temp: 98.1 F (36.7 C) (03/13 0548) Temp src: Oral (03/13 0548) BP: 105/65 mmHg (03/13 0548) Pulse Rate: 90 (03/13 0548)  Labs:  Recent Labs  07/31/12 2108 08/01/12 0620  HGB 12.8 11.3*  HCT 36.3 32.3*  PLT 195 166  LABPROT  --  15.2  INR  --  1.22  HEPARINUNFRC  --  0.50  CREATININE 0.61 0.62    Estimated Creatinine Clearance: 92.4 ml/min (by C-G formula based on Cr of 0.62).  Assessment: Symptoms x 3 weeks presenting with light-headedness and severe pain on inspiration= segmental PE. First heparin level 0.5 in goal range. Hgb down from 12.8 to 11.3. Plts 166  Goal of Therapy:  Heparin level 0.3-0.7 units/ml INR 2-3 Monitor platelets by anticoagulation protocol: Yes   Plan:  F/u Hypercoagulable panel  F/u Dopplers of LE F/u 2D Echo with EKG changes Continue heparin at 1300 units/hr and confirm level this pm. Coumadin 7.5mg  po x 1 tonight. Initiate Coumadin education with book/video.   Merilynn Finland, Crystal Stillinger 08/01/2012,8:44 AM

## 2012-08-02 DIAGNOSIS — D649 Anemia, unspecified: Secondary | ICD-10-CM

## 2012-08-02 DIAGNOSIS — I369 Nonrheumatic tricuspid valve disorder, unspecified: Secondary | ICD-10-CM

## 2012-08-02 LAB — HEPARIN LEVEL (UNFRACTIONATED): Heparin Unfractionated: 0.28 IU/mL — ABNORMAL LOW (ref 0.30–0.70)

## 2012-08-02 LAB — LUPUS ANTICOAGULANT PANEL
DRVVT: 35.4 secs (ref <42.9)
Lupus Anticoagulant: NOT DETECTED

## 2012-08-02 LAB — CBC
Hemoglobin: 10.6 g/dL — ABNORMAL LOW (ref 12.0–15.0)
MCH: 29.8 pg (ref 26.0–34.0)
Platelets: 183 10*3/uL (ref 150–400)
RBC: 3.56 MIL/uL — ABNORMAL LOW (ref 3.87–5.11)
WBC: 8.7 10*3/uL (ref 4.0–10.5)

## 2012-08-02 LAB — PROTEIN C ACTIVITY: Protein C Activity: 130 % (ref 75–133)

## 2012-08-02 LAB — PROTEIN C, TOTAL: Protein C, Total: 54 % — ABNORMAL LOW (ref 72–160)

## 2012-08-02 LAB — PROTEIN S ACTIVITY: Protein S Activity: 51 % — ABNORMAL LOW (ref 69–129)

## 2012-08-02 LAB — BASIC METABOLIC PANEL
Chloride: 107 mEq/L (ref 96–112)
GFR calc Af Amer: >90 mL/min (ref >90)
GFR calc non Af Amer: >90 mL/min (ref >90)
Potassium: 3.3 mEq/L — ABNORMAL LOW (ref 3.5–5.1)
Sodium: 142 mEq/L (ref 135–145)

## 2012-08-02 LAB — PROTEIN S, TOTAL: Protein S Ag, Total: 57 % — ABNORMAL LOW (ref 60–150)

## 2012-08-02 LAB — PROTIME-INR
INR: 1.21 (ref 0.00–1.49)
Prothrombin Time: 15.1 seconds (ref 11.6–15.2)

## 2012-08-02 MED ORDER — POTASSIUM CHLORIDE CRYS ER 20 MEQ PO TBCR
40.0000 meq | EXTENDED_RELEASE_TABLET | Freq: Once | ORAL | Status: AC
  Administered 2012-08-02: 40 meq via ORAL
  Filled 2012-08-02: qty 2

## 2012-08-02 MED ORDER — WARFARIN SODIUM 7.5 MG PO TABS
7.5000 mg | ORAL_TABLET | Freq: Once | ORAL | Status: AC
  Administered 2012-08-02: 7.5 mg via ORAL
  Filled 2012-08-02: qty 1

## 2012-08-02 NOTE — Care Management Note (Unsigned)
    Page 1 of 1   08/02/2012     3:47:03 PM   CARE MANAGEMENT NOTE 08/02/2012  Patient:  Cynthia Villegas, Cynthia Villegas   Account Number:  [id number]  Date Initiated:  08/02/2012  Documentation initiated by:  AMERSON,JULIE  Subjective/Objective Assessment:   PT ADM ON 07/31/12 WITH PULMONARY EMBOLUS.  PTA, PT INDEPENDENT.     Action/Plan:   WILL FOLLOW FOR HOME NEEDS.  QUESTION IF PT CAN DC HOME ON [place].   Anticipated DC Date:  08/04/2012   Anticipated DC Plan:  HOME/SELF CARE      DC Planning Services  CM consult      Choice offered to / List presented to:             Status of service:  In process, will continue to follow Medicare Important Message given?   (If response is "NO", the following Medicare IM given date fields will be blank) Date Medicare IM given:   Date Additional Medicare IM given:    Discharge Disposition:    Per UR Regulation:  Reviewed for med. necessity/level of care/duration of stay  If discussed at Long Length of Stay Meetings, dates discussed:    Comments:

## 2012-08-02 NOTE — Progress Notes (Signed)
ANTICOAGULATION CONSULT NOTE - Follow Up Consult  Pharmacy Consult for heparin Indication: pulmonary embolus  Labs:  Recent Labs  07/31/12 2108 08/01/12 0620 08/01/12 1240 08/02/12 0450  HGB 12.8 11.3*  --  10.6*  HCT 36.3 32.3*  --  31.1*  PLT 195 166  --  183  LABPROT  --  15.2  --  15.1  INR  --  1.22  --  1.21  HEPARINUNFRC  --  0.50 0.39 0.28*  CREATININE 0.61 0.62  --   --     Assessment: 52yo female now subtherapeutic on heparin after two levels at goal though had been trending down.  Goal of Therapy:  Heparin level 0.3-0.7 units/ml   Plan:  Will increase heparin gtt by 1 unit/kg/hr to 1400 units/hr and check level in 6hr.  Vernard Gambles, PharmD, BCPS  08/02/2012,5:40 AM

## 2012-08-02 NOTE — Progress Notes (Signed)
ANTICOAGULATION CONSULT NOTE - Follow Up Consult  Pharmacy Consult for Heparin and Coumadin Indication: RLL PE  No Known Allergies  Patient Measurements: Height: 5\' 6"  (167.6 cm) Weight: 200 lb 13.4 oz (91.1 kg) IBW/kg (Calculated) : 59.3 Heparin Dosing Weight: 79kg  Vital Signs: Temp: 98.3 F (36.8 C) (03/14 0620) Temp src: Oral (03/14 0620) BP: 118/70 mmHg (03/14 0620) Pulse Rate: 89 (03/14 0620)  Labs:  Recent Labs  07/31/12 2108  08/01/12 0620 08/01/12 1240 08/02/12 0450 08/02/12 1145  HGB 12.8  --  11.3*  --  10.6*  --   HCT 36.3  --  32.3*  --  31.1*  --   PLT 195  --  166  --  183  --   LABPROT  --   --  15.2  --  15.1  --   INR  --   --  1.22  --  1.21  --   HEPARINUNFRC  --   < > 0.50 0.39 0.28* 0.42  CREATININE 0.61  --  0.62  --  0.60  --   < > = values in this interval not displayed.  Estimated Creatinine Clearance: 92.4 ml/min (by C-G formula based on Cr of 0.6).   Medications:  Heparin @ 1400 units/hr  Assessment: 51yof continues on heparin and coumadin day #2 overlap for PE. Heparin level is therapeutic after rate increase this morning. INR remains subtherapeutic after first dose of coumadin as expected. Hgb/Hct slowly trending down, platelets stable. No bleeding reported.  Goal of Therapy:  Heparin level 0.3-0.7 units/ml INR 2-3 Monitor platelets by anticoagulation protocol: Yes   Plan:  1) Continue heparin at 1400 units/hr 2) Repeat coumadin 7.5mg  x 1 tonight 3) Follow up heparin level, INR, CBC in AM  Fredrik Rigger 08/02/2012,1:17 PM

## 2012-08-02 NOTE — Progress Notes (Signed)
[facility] PROGRESS NOTE  Cynthia Villegas GEX:[id number] DOB: [birth year] DOA: 07/31/2012 PCP: Cain Saupe, MD  Brief narrative: 52 year old female with a significant past medical history of Graves' disease and subsequent hypothyroidism who presented to [facility] ED 07/31/12 with chest pain associated with respirations.  In ED, evaluation included CT angiogram chest which was significant foran acute pulmonary embolism within a right lower lobe segmental branch. Patient was started on Coumadin and heparin. Further evaluation included LE doppler which was negative for DVT. INR is still subtherapeutic.  Assessment/Plan:    Principal Problem:  *PE (pulmonary embolism)  Unclear etiology, hypercoagulable state workup is still pending; cardiolipin normal CT chest angio chest confirmed pulmonary embolism in right lower lobe segmental branch.  LE doppler negative for DVT Continue coumadin and heparin per pharmacy Active Problems:  Hypokalemia  Repleted today as well Followup BMP in the morning Unspecified hypothyroidism  Continue levothyroxine 137 mcg a day Follow up TSH -WNL Anemia  Hemoglobin was within normal limits on admission and 10.6 today.  Follow up CBC in am.   Code Status: Full code  Family Communication: Family at bedside  Disposition Plan: Home when stable   Manson Passey, MD  [facility]  Pager [phone]   Consultants:  None  Procedures:  None  Antibiotics:  None   If 7PM-7AM, please contact night-coverage [email or web address] Password [facility] 08/02/2012, 11:19 AM   LOS: 2 days    HPI/Subjective: No acute overnight events.  Objective: Filed Vitals:   08/01/12 0548 08/01/12 1350 08/01/12 2027 08/02/12 0620  BP: 105/65 108/70 123/66 118/70  Pulse: 90 95 87 89  Temp: 98.1 F (36.7 C) 98.2 F (36.8 C) 98.3 F (36.8 C) 98.3 F (36.8 C)  TempSrc: Oral Oral Oral Oral  Resp: 20 18 18 18   Height:      Weight:      SpO2: 96% 97% 100% 100%    Intake/Output Summary (Last 24  hours) at 08/02/12 1119 Last data filed at 08/02/12 0800  Gross per 24 hour  Intake    480 ml  Output   1100 ml  Net   -620 ml    Exam:   General:  Pt is alert, follows commands appropriately, not in acute distress  Cardiovascular: Regular rate and rhythm, S1/S2, no murmurs, no rubs, no gallops  Respiratory: Clear to auscultation bilaterally, no wheezing, no crackles, no rhonchi  Abdomen: Soft, non tender, non distended, bowel sounds present, no guarding  Extremities: No edema, pulses DP and PT palpable bilaterally  Neuro: Grossly nonfocal  Data Reviewed: Basic Metabolic Panel:  Recent Labs Lab 07/31/12 2108 08/01/12 0620 08/02/12 0450  NA 143 141 142  K 3.0* 3.4* 3.3*  CL 102 105 107  CO2 28 27 25   GLUCOSE 117* 112* 90  BUN 11 9 7   CREATININE 0.61 0.62 0.60  CALCIUM 9.6 8.7 8.7  MG  --  1.8  --   PHOS  --  3.6  --    Liver Function Tests:  Recent Labs Lab 07/31/12 2108 08/01/12 0620  AST 58* 32  ALT 52* 38*  ALKPHOS 94 80  BILITOT 0.6 0.6  PROT 8.5* 7.0  ALBUMIN 4.1 3.2*    Recent Labs Lab 07/31/12 2108  LIPASE 24   No results found for this basename: AMMONIA,  in the last 168 hours CBC:  Recent Labs Lab 07/31/12 2108 08/01/12 0620 08/02/12 0450  WBC 11.1* 10.0 8.7  NEUTROABS 9.2*  --   --   HGB 12.8  11.3* 10.6*  HCT 36.3 32.3* 31.1*  MCV 85.4 86.1 87.4  PLT 195 166 183    Studies: Dg Chest 2 View 07/31/2012   IMPRESSION: Low inspiratory volumes with bibasilar atelectasis versus scarring.   Original Report Authenticated By: Malachy Moan, M.D.    Ct Angio Chest Pe W/cm &/or Wo Cm 07/31/2012  * IMPRESSION: Acute pulmonary embolism within a right lower lobe segmental branch.  Patchy airspace opacities within the lower lobes, middle lobe, and lingula.  May reflect atelectasis or infection.  Infarction not entirely exclude however felt to be less likely.  Critical Value/emergent results were called by telephone at the time of  interpretation on 07/31/2012 at 11:15 p.m. to [facility], who verbally acknowledged these results.   Original Report Authenticated By: Jearld Lesch, M.D.     Scheduled Meds: . docusate sodium  100 mg Oral BID  . levothyroxine  137 mcg Oral QAC breakfast  . potassium chloride  40 mEq Oral Once   Continuous Infusions: . heparin 1,400 Units/hr (08/02/12 8657)

## 2012-08-02 NOTE — Progress Notes (Signed)
  Echocardiogram 2D Echocardiogram has been performed.  Jorje Guild 08/02/2012, 9:14 AM

## 2012-08-03 LAB — BASIC METABOLIC PANEL
BUN: 6 mg/dL (ref 6–23)
CO2: 28 mEq/L (ref 19–32)
Calcium: 8.9 mg/dL (ref 8.4–10.5)
GFR calc non Af Amer: >90 mL/min (ref >90)
Glucose, Bld: 87 mg/dL (ref 70–99)
Sodium: 141 mEq/L (ref 135–145)

## 2012-08-03 LAB — CBC
HCT: 32.2 % — ABNORMAL LOW (ref 36.0–46.0)
MCH: 30.1 pg (ref 26.0–34.0)
MCV: 86.6 fL (ref 78.0–100.0)
Platelets: 206 10*3/uL (ref 150–400)
RDW: 13 % (ref 11.5–15.5)

## 2012-08-03 LAB — URINE CULTURE: Special Requests: NORMAL

## 2012-08-03 MED ORDER — WARFARIN SODIUM 7.5 MG PO TABS
7.5000 mg | ORAL_TABLET | Freq: Once | ORAL | Status: AC
  Administered 2012-08-03: 7.5 mg via ORAL
  Filled 2012-08-03: qty 1

## 2012-08-03 MED ORDER — SODIUM CHLORIDE 0.9 % IV SOLN
INTRAVENOUS | Status: DC
  Administered 2012-08-03 – 2012-08-04 (×2): 10 mL/h via INTRAVENOUS

## 2012-08-03 NOTE — Progress Notes (Signed)
Pt c/o "feeling full" but no abdominal tenderness or discomfort. Pt has active bowl sounds, provided prune juice to assist with BM.   Thane Edu, RN

## 2012-08-03 NOTE — Progress Notes (Signed)
[facility] PROGRESS NOTE  Cynthia ABRAMO ZOX:[id number] DOB: 02-Nov-1960 DOA: 07/31/2012 PCP: Cain Saupe, MD  Brief narrative: 52 year old female with a significant past medical history of Graves' disease and subsequent hypothyroidism who presented to [facility] ED 07/31/12 with chest pain associated with respirations.  In ED, evaluation included CT angiogram chest which was significant foran acute pulmonary embolism within a right lower lobe segmental branch. Patient was started on Coumadin and heparin. Further evaluation included LE doppler which was negative for DVT. INR is still subtherapeutic.   Assessment/Plan:   Principal Problem:  *PE (pulmonary embolism)  Unclear etiology, hypercoagulable state workup is still pending; cardiolipin normal  CT chest angio chest confirmed pulmonary embolism in right lower lobe segmental branch.  LE doppler negative for DVT  Continue coumadin and heparin per pharmacy INR 1.26 today Active Problems:  Hypokalemia  Resolved  Unspecified hypothyroidism  Continue levothyroxine 137 mcg a day  TSH -WNL Anemia  Hemoglobin was within normal limits on admission  Hemoglobin stable at 11.2   Code Status: Full code  Family Communication: Family at bedside  Disposition Plan: Home when stable   Manson Passey, MD  [facility]  Pager [phone]   Consultants:  None  Procedures:  None  Antibiotics:  None     If 7PM-7AM, please contact night-coverage [email or web address] Password [email or web address] 08/03/2012, 6:12 AM   LOS: 3 days   HPI/Subjective: No acute overnight events. Feels better.  Objective: Filed Vitals:   08/02/12 0620 08/02/12 1400 08/02/12 2054 08/03/12 0546  BP: 118/70 110/68 135/72 128/73  Pulse: 89 76 80 85  Temp: 98.3 F (36.8 C) 98.5 F (36.9 C) 98.1 F (36.7 C) 98.7 F (37.1 C)  TempSrc: Oral Oral Oral Oral  Resp: 18 19 18 17   Height:      Weight:      SpO2: 100% 100% 97% 96%    Intake/Output Summary (Last 24 hours) at 08/03/12  0612 Last data filed at 08/03/12 0548  Gross per 24 hour  Intake   1616 ml  Output   2200 ml  Net   -584 ml    Exam:   General:  Pt is alert, follows commands appropriately, not in acute distress  Cardiovascular: Regular rate and rhythm, S1/S2, no murmurs, no rubs, no gallops  Respiratory: Clear to auscultation bilaterally, no wheezing, no crackles, no rhonchi  Abdomen: Soft, non tender, non distended, bowel sounds present, no guarding  Extremities: No edema, pulses DP and PT palpable bilaterally  Neuro: Grossly nonfocal  Data Reviewed: Basic Metabolic Panel:  Recent Labs Lab 07/31/12 2108 08/01/12 0620 08/02/12 0450 08/03/12 0445  NA 143 141 142 141  K 3.0* 3.4* 3.3* 3.5  CL 102 105 107 104  CO2 28 27 25 28   GLUCOSE 117* 112* 90 87  BUN 11 9 7 6   CREATININE 0.61 0.62 0.60 0.59  CALCIUM 9.6 8.7 8.7 8.9  MG  --  1.8  --   --   PHOS  --  3.6  --   --    Liver Function Tests:  Recent Labs Lab 07/31/12 2108 08/01/12 0620  AST 58* 32  ALT 52* 38*  ALKPHOS 94 80  BILITOT 0.6 0.6  PROT 8.5* 7.0  ALBUMIN 4.1 3.2*    Recent Labs Lab 07/31/12 2108  LIPASE 24   No results found for this basename: AMMONIA,  in the last 168 hours CBC:  Recent Labs Lab 07/31/12 2108 08/01/12 0620 08/02/12 0450 08/03/12 0445  WBC 11.1* 10.0  8.7 8.8  NEUTROABS 9.2*  --   --   --   HGB 12.8 11.3* 10.6* 11.2*  HCT 36.3 32.3* 31.1* 32.2*  MCV 85.4 86.1 87.4 86.6  PLT 195 166 183 206   Cardiac Enzymes: No results found for this basename: CKTOTAL, CKMB, CKMBINDEX, TROPONINI,  in the last 168 hours BNP: No components found with this basename: POCBNP,  CBG: No results found for this basename: GLUCAP,  in the last 168 hours  Recent Results (from the past 240 hour(s))  URINE CULTURE     Status: None   Collection Time    08/01/12  4:54 PM      Result Value Range Status   Specimen Description URINE, RANDOM   Final   Special Requests Normal   Final   Culture  Setup Time  08/02/2012 01:43   Final   Colony Count NO GROWTH   Final   Culture NO GROWTH   Final   Report Status 08/03/2012 FINAL   Final     Studies: No results found.  Scheduled Meds: . docusate sodium  100 mg Oral BID  . levothyroxine  137 mcg Oral QAC breakfast  . sodium chloride  3 mL Intravenous Q12H  . Warfarin - Pharmacist Dosing Inpatient   Does not apply q1800   Continuous Infusions: . heparin 1,400 Units/hr (08/02/12 8413)

## 2012-08-03 NOTE — Progress Notes (Signed)
ANTICOAGULATION CONSULT NOTE - Follow Up Consult  Pharmacy Consult for Heparin and Coumadin Indication: RLL PE  No Known Allergies  Patient Measurements: Height: 5\' 6"  (167.6 cm) Weight: 200 lb 13.4 oz (91.1 kg) IBW/kg (Calculated) : 59.3 Heparin Dosing Weight: 79kg  Vital Signs: Temp: 98.7 F (37.1 C) (03/15 0546) Temp src: Oral (03/15 0546) BP: 128/73 mmHg (03/15 0546) Pulse Rate: 85 (03/15 0546)  Labs:  Recent Labs  08/01/12 0620  08/02/12 0450 08/02/12 1145 08/03/12 0445  HGB 11.3*  --  10.6*  --  11.2*  HCT 32.3*  --  31.1*  --  32.2*  PLT 166  --  183  --  206  LABPROT 15.2  --  15.1  --  15.5*  INR 1.22  --  1.21  --  1.25  HEPARINUNFRC 0.50  < > 0.28* 0.42 0.40  CREATININE 0.62  --  0.60  --  0.59  < > = values in this interval not displayed.  Estimated Creatinine Clearance: 92.4 ml/min (by C-G formula based on Cr of 0.59).   Medications:  Heparin @ 1400 units/hr  Assessment: 51yof continues on heparin and coumadin day #3 overlap for PE. Heparin level is therapeutic. INR remains subtherapeutic. Hgb/Hct low but stable, platelets stable. No bleeding reported.  Goal of Therapy:  Heparin level 0.3-0.7 units/ml INR 2-3 Monitor platelets by anticoagulation protocol: Yes   Plan:  1) Continue heparin at 1400 units/hr 2) Repeat coumadin 7.5mg  x 1 tonight 3) Follow up heparin level, INR, CBC in AM  Jacarra Bobak Poteet 08/03/2012,10:03 AM

## 2012-08-04 DIAGNOSIS — R079 Chest pain, unspecified: Secondary | ICD-10-CM

## 2012-08-04 LAB — HEPARIN LEVEL (UNFRACTIONATED): Heparin Unfractionated: 0.29 IU/mL — ABNORMAL LOW (ref 0.30–0.70)

## 2012-08-04 LAB — TROPONIN I
Troponin I: <0.3 ng/mL (ref <0.30)
Troponin I: <0.3 ng/mL (ref <0.30)

## 2012-08-04 LAB — PROTIME-INR
INR: 1.48 (ref 0.00–1.49)
Prothrombin Time: 17.5 seconds — ABNORMAL HIGH (ref 11.6–15.2)

## 2012-08-04 LAB — CBC
Platelets: 238 10*3/uL (ref 150–400)
RBC: 3.69 MIL/uL — ABNORMAL LOW (ref 3.87–5.11)
RDW: 12.9 % (ref 11.5–15.5)
WBC: 7.3 10*3/uL (ref 4.0–10.5)

## 2012-08-04 MED ORDER — NITROGLYCERIN 0.4 MG SL SUBL: 0.4000 mg | SUBLINGUAL_TABLET | SUBLINGUAL | Status: DC | PRN

## 2012-08-04 MED ORDER — WARFARIN SODIUM 7.5 MG PO TABS
7.5000 mg | ORAL_TABLET | Freq: Once | ORAL | Status: AC
  Administered 2012-08-04: 7.5 mg via ORAL
  Filled 2012-08-04: qty 1

## 2012-08-04 NOTE — Progress Notes (Addendum)
ANTICOAGULATION CONSULT NOTE - Follow Up Consult  Pharmacy Consult for Heparin and Coumadin Indication: RLL PE  No Known Allergies  Patient Measurements: Height: 5\' 6"  (167.6 cm) Weight: 200 lb 13.4 oz (91.1 kg) IBW/kg (Calculated) : 59.3 Heparin Dosing Weight: 79kg  Vital Signs: Temp: 98.2 F (36.8 C) (03/16 0433) Temp src: Oral (03/16 0433) BP: 125/72 mmHg (03/16 0433) Pulse Rate: 70 (03/16 0433)  Labs:  Recent Labs  08/02/12 0450 08/02/12 1145 08/03/12 0445 08/04/12 0615 08/04/12 0806  HGB 10.6*  --  11.2* 11.2*  --   HCT 31.1*  --  32.2* 31.7*  --   PLT 183  --  206 238  --   LABPROT 15.1  --  15.5* 17.5*  --   INR 1.21  --  1.25 1.48  --   HEPARINUNFRC 0.28* 0.42 0.40 0.29*  --   CREATININE 0.60  --  0.59  --   --   TROPONINI  --   --   --   --  <0.30    Estimated Creatinine Clearance: 92.4 ml/min (by C-G formula based on Cr of 0.59).   Medications:  Heparin @ 1400 units/hr  Assessment: 51yof continues on heparin and coumadin day #4 overlap for PE. Heparin level is slightly subtherapeutic. INR remains subtherapeutic and trending up. Hgb/Hct low but stable, platelets stable. No bleeding reported.  Goal of Therapy:  Heparin level 0.3-0.7 units/ml INR 2-3 Monitor platelets by anticoagulation protocol: Yes   Plan:  1) Increase heparin to 1500 units/hr 2) Repeat coumadin 7.5mg  x 1 tonight 3)  Recheck heparin level 8 hours after rate increase 4) Follow up heparin level, INR, CBC in AM  Brycen Bean Poteet 08/04/2012,10:01 AM  Addum:  Repeat level therapeutic.  Cont drip at 1500 units/hr.  F.u am labs

## 2012-08-04 NOTE — Progress Notes (Signed)
[facility] PROGRESS NOTE  Cynthia Villegas ZOX:[id number] DOB: 27-Dec-1960 DOA: 07/31/2012 PCP: Cain Saupe, MD  Brief narrative: 52 year old female with a significant past medical history of Graves' disease and subsequent hypothyroidism who presented to [facility] ED 07/31/12 with chest pain associated with respirations.  In ED, evaluation included CT angiogram chest which was significant foran acute pulmonary embolism within a right lower lobe segmental branch. Patient was started on Coumadin and heparin. Further evaluation included LE doppler which was negative for DVT. At this time we are awaiting INR to be at therapeutic level. INR 1.48 today.  Assessment/Plan:   Principal Problem:  *PE (pulmonary embolism)  Unclear etiology, hypercoagulable state workup essentially unremarkable. CT chest angio chest confirmed pulmonary embolism in right lower lobe segmental branch.  LE doppler negative for DVT  Continue coumadin and heparin per pharmacy  INR today 1.48  Active Problems:  Chest pain  Initially presumed ot be secondary to pulmonary embolism. Patient continue to complain of chest pain so we will cycle cardiac enzymes.   2 D ECHO on this admission showed EF of 70%  Nitroglycerin PRN Hypokalemia  Resolved  Unspecified hypothyroidism  Continue levothyroxine 137 mcg a day  TSH -WNL Anemia  Hemoglobin was within normal limits on admission  Hemoglobin stable at 11.2   Code Status: Full code  Family Communication: Family at bedside  Disposition Plan: Home when stable   Manson Passey, MD  [facility]  Pager [phone]   Consultants:  None  Procedures:  None  Antibiotics:  None    If 7PM-7AM, please contact night-coverage [email or web address] Password [email or web address] 08/04/2012, 6:12 AM   LOS: 4 days    HPI/Subjective: No acute overnight events.  Objective: Filed Vitals:   08/03/12 0546 08/03/12 1349 08/03/12 2001 08/04/12 0433  BP: 128/73 109/72 140/71 125/72  Pulse: 85 77 82 70   Temp: 98.7 F (37.1 C) 99.7 F (37.6 C) 99 F (37.2 C) 98.2 F (36.8 C)  TempSrc: Oral Oral Oral Oral  Resp: 17 18 18 18   Height:      Weight:      SpO2: 96% 97% 98% 96%    Intake/Output Summary (Last 24 hours) at 08/04/12 0612 Last data filed at 08/03/12 2108  Gross per 24 hour  Intake      3 ml  Output   1950 ml  Net  -1947 ml    Exam:   General:  Pt is alert, follows commands appropriately, not in acute distress  Cardiovascular: Regular rate and rhythm, S1/S2, no murmurs, no rubs, no gallops  Respiratory: Clear to auscultation bilaterally, no wheezing, no crackles, no rhonchi  Abdomen: Soft, non tender, non distended, bowel sounds present, no guarding  Extremities: No edema, pulses DP and PT palpable bilaterally  Neuro: Grossly nonfocal  Data Reviewed: Basic Metabolic Panel:  Recent Labs Lab 07/31/12 2108 08/01/12 0620 08/02/12 0450 08/03/12 0445  NA 143 141 142 141  K 3.0* 3.4* 3.3* 3.5  CL 102 105 107 104  CO2 28 27 25 28   GLUCOSE 117* 112* 90 87  BUN 11 9 7 6   CREATININE 0.61 0.62 0.60 0.59  CALCIUM 9.6 8.7 8.7 8.9  MG  --  1.8  --   --   PHOS  --  3.6  --   --    Liver Function Tests:  Recent Labs Lab 07/31/12 2108 08/01/12 0620  AST 58* 32  ALT 52* 38*  ALKPHOS 94 80  BILITOT 0.6 0.6  PROT 8.5* 7.0  ALBUMIN 4.1 3.2*    Recent Labs Lab 07/31/12 2108  LIPASE 24   No results found for this basename: AMMONIA,  in the last 168 hours CBC:  Recent Labs Lab 07/31/12 2108 08/01/12 0620 08/02/12 0450 08/03/12 0445  WBC 11.1* 10.0 8.7 8.8  NEUTROABS 9.2*  --   --   --   HGB 12.8 11.3* 10.6* 11.2*  HCT 36.3 32.3* 31.1* 32.2*  MCV 85.4 86.1 87.4 86.6  PLT 195 166 183 206   Cardiac Enzymes: No results found for this basename: CKTOTAL, CKMB, CKMBINDEX, TROPONINI,  in the last 168 hours BNP: No components found with this basename: POCBNP,  CBG: No results found for this basename: GLUCAP,  in the last 168 hours  Recent Results  (from the past 240 hour(s))  URINE CULTURE     Status: None   Collection Time    08/01/12  4:54 PM      Result Value Range Status   Specimen Description URINE, RANDOM   Final   Special Requests Normal   Final   Culture  Setup Time 08/02/2012 01:43   Final   Colony Count NO GROWTH   Final   Culture NO GROWTH   Final   Report Status 08/03/2012 FINAL   Final     Studies: No results found.  Scheduled Meds: . docusate sodium  100 mg Oral BID  . levothyroxine  137 mcg Oral QAC breakfast  . sodium chloride  3 mL Intravenous Q12H  . Warfarin - Pharmacist Dosing Inpatient   Does not apply q1800   Continuous Infusions: . sodium chloride 10 mL/hr (08/03/12 1100)  . heparin 1,400 Units/hr (08/04/12 0317)

## 2012-08-05 DIAGNOSIS — E039 Hypothyroidism, unspecified: Secondary | ICD-10-CM

## 2012-08-05 LAB — HEPARIN LEVEL (UNFRACTIONATED): Heparin Unfractionated: 0.4 IU/mL (ref 0.30–0.70)

## 2012-08-05 LAB — CBC
HCT: 35.5 % — ABNORMAL LOW (ref 36.0–46.0)
Hemoglobin: 12.3 g/dL (ref 12.0–15.0)
MCH: 29.3 pg (ref 26.0–34.0)
MCHC: 34.6 g/dL (ref 30.0–36.0)
MCV: 84.5 fL (ref 78.0–100.0)

## 2012-08-05 LAB — PROTIME-INR: INR: 1.62 — ABNORMAL HIGH (ref 0.00–1.49)

## 2012-08-05 MED ORDER — WARFARIN SODIUM 10 MG PO TABS
10.0000 mg | ORAL_TABLET | Freq: Once | ORAL | Status: AC
  Administered 2012-08-05: 10 mg via ORAL
  Filled 2012-08-05: qty 1

## 2012-08-05 NOTE — Progress Notes (Addendum)
[facility] PROGRESS NOTE  Cynthia Villegas AVW:[id number] DOB: 1961/05/03 DOA: 07/31/2012 PCP: Cain Saupe, MD  Brief narrative: 52 year old female with past medical history of Graves' disease and subsequent hypothyroidism who presented to [facility] ED 07/31/12 with pleuritic chest pains.  In ED, evaluation included CT angiogram chest which was significant for  acute pulmonary embolism within a right lower lobe segmental branch. Patient was started on Coumadin and heparin. Further evaluation included LE doppler which was negative for DVT. At this time we are awaiting INR to be at therapeutic level. INR 1.62 today.  Assessment/Plan:   Principal Problem:  *PE (pulmonary embolism)  Unclear etiology, hypercoagulable state workup essentially unremarkable- except that protein S activity is low- ? Significance. Recommend OP hematology consultation in a couple of months to evaluate for etiology of hypercoagulable state.. CT chest angio chest confirmed pulmonary embolism in right lower lobe segmental branch.  LE doppler negative for DVT  Continue coumadin and heparin per pharmacy  INR today 1.62 Echo: mild LVH. EF 70%. No evidence of marked RV dysfunction.  Active Problems:  Chest pain  Initially presumed ot be secondary to pulmonary embolism. Patient continued to complain of chest pain so cardiac enzymes were cycled and negative.   2 D ECHO on this admission showed EF of 70%  Nitroglycerin PRN\  Chest pain is pleuritic- lower substernal  Hypokalemia  Resolved   Unspecified hypothyroidism  Continue levothyroxine 137 mcg a day  TSH -WNL  Anemia  Hemoglobin was within normal limits on admission  Hemoglobin stable at 11.2   Code Status: Full code  Family Communication: Family at bedside  Disposition Plan: Home when stable & INR therapeutic   Consultants:  None   Procedures:  None   Antibiotics:  None    HPI/Subjective: No acute overnight events. Lower substernal sharp  pain on deep inspiration or cough. Mild DOE.  Objective: Filed Vitals:   08/04/12 0433 08/04/12 1700 08/04/12 2100 08/05/12 0416  BP: 125/72 140/72 128/72 129/71  Pulse: 70 73 74 73  Temp: 98.2 F (36.8 C) 98.7 F (37.1 C) 98.4 F (36.9 C) 98.5 F (36.9 C)  TempSrc: Oral Oral Oral Oral  Resp: 18 16 16 16   Height:      Weight:      SpO2: 96% 98% 98% 99%    Intake/Output Summary (Last 24 hours) at 08/05/12 0755 Last data filed at 08/04/12 2104  Gross per 24 hour  Intake    243 ml  Output   3100 ml  Net  -2857 ml    Exam:   General:  Sitting up eating breakfast. Comfortable.  Cardiovascular: Regular rate and rhythm, S1/S2, no murmurs, no rubs, no gallops. Tele: SR  Respiratory: slightly reduced breath sounds in the bases, but without crackles, wheezing or rhonchi.. No pleural rubs. No increased work of breathing.  Abdomen: Soft, non tender, non distended, bowel sounds present, no guarding  Extremities: No edema, pulses DP and PT palpable bilaterally. 5/5 power symmetrically.  Neuro: Grossly non focal. Alert and oriented  Data Reviewed: Basic Metabolic Panel:  Recent Labs Lab 07/31/12 2108 08/01/12 0620 08/02/12 0450 08/03/12 0445  NA 143 141 142 141  K 3.0* 3.4* 3.3* 3.5  CL 102 105 107 104  CO2 28 27 25 28   GLUCOSE 117* 112* 90 87  BUN 11 9 7 6   CREATININE 0.61 0.62 0.60 0.59  CALCIUM 9.6 8.7 8.7 8.9  MG  --  1.8  --   --  PHOS  --  3.6  --   --    Liver Function Tests:  Recent Labs Lab 07/31/12 2108 08/01/12 0620  AST 58* 32  ALT 52* 38*  ALKPHOS 94 80  BILITOT 0.6 0.6  PROT 8.5* 7.0  ALBUMIN 4.1 3.2*    Recent Labs Lab 07/31/12 2108  LIPASE 24   No results found for this basename: AMMONIA,  in the last 168 hours CBC:  Recent Labs Lab 07/31/12 2108 08/01/12 0620 08/02/12 0450 08/03/12 0445 08/04/12 0615 08/05/12 0552  WBC 11.1* 10.0 8.7 8.8 7.3 7.6  NEUTROABS 9.2*  --   --   --   --   --   HGB 12.8 11.3* 10.6* 11.2* 11.2*  12.3  HCT 36.3 32.3* 31.1* 32.2* 31.7* 35.5*  MCV 85.4 86.1 87.4 86.6 85.9 84.5  PLT 195 166 183 206 238 305   Cardiac Enzymes:  Recent Labs Lab 08/04/12 0806 08/04/12 1310 08/04/12 1830  TROPONINI <0.30 <0.30 <0.30   BNP: No components found with this basename: POCBNP,  CBG: No results found for this basename: GLUCAP,  in the last 168 hours  Recent Results (from the past 240 hour(s))  URINE CULTURE     Status: None   Collection Time    08/01/12  4:54 PM      Result Value Range Status   Specimen Description URINE, RANDOM   Final   Special Requests Normal   Final   Culture  Setup Time 08/02/2012 01:43   Final   Colony Count NO GROWTH   Final   Culture NO GROWTH   Final   Report Status 08/03/2012 FINAL   Final     Studies: No results found.  Scheduled Meds: . docusate sodium  100 mg Oral BID  . levothyroxine  137 mcg Oral QAC breakfast  . sodium chloride  3 mL Intravenous Q12H  . Warfarin - Pharmacist Dosing Inpatient   Does not apply q1800   Continuous Infusions: . sodium chloride 10 mL/hr (08/04/12 1451)  . heparin 1,500 Units/hr (08/04/12 1955)   Additional labs:  Homocysteine: 7.8  Lupus Anticoagulant negative    Fitzhugh Vizcarrondo  Pager: [phone]    If 7PM-7AM, please contact night-coverage  [email or web address]  Password [email or web address]  08/05/2012, 8:11 AM  LOS: 5 days

## 2012-08-05 NOTE — Progress Notes (Signed)
ANTICOAGULATION CONSULT NOTE - Follow Up Consult  Pharmacy Consult for Heparin and Coumadin Indication: RLL PE  No Known Allergies  Patient Measurements: Height: 5\' 6"  (167.6 cm) Weight: 200 lb 13.4 oz (91.1 kg) IBW/kg (Calculated) : 59.3 Heparin Dosing Weight: 79kg  Vital Signs: Temp: 98.5 F (36.9 C) (03/17 0416) Temp src: Oral (03/17 0416) BP: 129/71 mmHg (03/17 0416) Pulse Rate: 73 (03/17 0416)  Labs:  Recent Labs  08/03/12 0445 08/04/12 0615 08/04/12 0806 08/04/12 1310 08/04/12 1830 08/05/12 0552  HGB 11.2* 11.2*  --   --   --  12.3  HCT 32.2* 31.7*  --   --   --  35.5*  PLT 206 238  --   --   --  305  LABPROT 15.5* 17.5*  --   --   --  18.7*  INR 1.25 1.48  --   --   --  1.62*  HEPARINUNFRC 0.40 0.29*  --   --  0.37 0.40  CREATININE 0.59  --   --   --   --   --   TROPONINI  --   --  <0.30 <0.30 <0.30  --     Estimated Creatinine Clearance: 92.4 ml/min (by C-G formula based on Cr of 0.59).   Medications:  Heparin @ 1500 units/hr  Assessment: 51yof continues on heparin and coumadin day #5 overlap for PE. Heparin level is therapeutic. INR remains subtherapeutic and trending up after 4 doses of 7.5 mg coumadin. Hgb/Hct up a bit to 12.3/35.5.  PLTC stable.   No bleeding reported.  Goal of Therapy:  Heparin level 0.3-0.7 units/ml INR 2-3 Monitor platelets by anticoagulation protocol: Yes   Plan:  1)continue heparin at  1500 units/hr 2) coumadin 10 mg po x 1 dose today - will give early 3) consider LMWH 140 mg q24 or 90 mg  q12 if pt ready for DC home 4) Follow up heparin level, INR, CBC in AM Herby Abraham, Pharm.D. [phone] 08/05/2012 10:52 AM

## 2012-08-06 LAB — CBC
HCT: 34.5 % — ABNORMAL LOW (ref 36.0–46.0)
Hemoglobin: 12.2 g/dL (ref 12.0–15.0)
MCH: 30 pg (ref 26.0–34.0)
RBC: 4.06 MIL/uL (ref 3.87–5.11)

## 2012-08-06 LAB — PROTIME-INR
INR: 2.24 — ABNORMAL HIGH (ref 0.00–1.49)
Prothrombin Time: 23.8 seconds — ABNORMAL HIGH (ref 11.6–15.2)

## 2012-08-06 MED ORDER — WARFARIN SODIUM 5 MG PO TABS: 5.0000 mg | ORAL_TABLET | Freq: Every day | ORAL | Status: DC

## 2012-08-06 MED ORDER — WARFARIN SODIUM 7.5 MG PO TABS
7.5000 mg | ORAL_TABLET | Freq: Once | ORAL | Status: DC
  Filled 2012-08-06: qty 1

## 2012-08-06 MED ORDER — ENOXAPARIN SODIUM 100 MG/ML ~~LOC~~ SOLN
1.0000 mg/kg | Freq: Once | SUBCUTANEOUS | Status: AC
  Administered 2012-08-06: 90 mg via SUBCUTANEOUS
  Filled 2012-08-06: qty 1

## 2012-08-06 NOTE — Progress Notes (Signed)
Discharge instructions along with med list and scripts provided. IV d/c'd with catheter intact. Patient transported via wheelchair to lobby for d/c home. Cynthia Villegas

## 2012-08-06 NOTE — Discharge Summary (Signed)
Physician Discharge Summary  Cynthia Villegas WGN:[id number] DOB: 01-Mar-1961 DOA: 07/31/2012  PCP: Cain Saupe, MD  Admit date: 07/31/2012 Discharge date: 08/06/2012  Time spent: 40 minutes  Recommendations for Outpatient Follow-up:  1. Please follow up in the INR clinic as scheduled.   Discharge Diagnoses:  Principal Problem:   PE (pulmonary embolism) Active Problems:   Hypokalemia   Unspecified hypothyroidism   Anemia   Chest pain  Discharge Condition: stable  Diet recommendation: regular  Filed Weights   07/31/12 2324 08/01/12 0145  Weight: 91.173 kg (201 lb) 91.1 kg (200 lb 13.4 oz)   History of present illness:  Cynthia Villegas is a 52 y.o. female has a past medical history of Graves disease. Presented with 3 weeks ago she developed nausea and vomiting and pain under both breast which was worse with inspiration. She went to PCP and her urine sowed evidence of UTI. She was started on cipro and then switched to Penicilin K she was told she was not on the right antibiotic.There is no urine culture results available. She continued to have intermittent chest pain and thought to start on Prilosec without much relief. Today she was at work and developed light-headedness and severe pain with inspiration she came to ER and was diagnosed with subsegmental PE. Hypercoagulable panel was ordered and she was started on Heparin. She deines any leg swelling, no travelling history. She sits at her computer during work day. Her mother has hx of Breast cancer diagnosed in her 74's and had hx of DVT in the setting of knee surgery. Patient have had a mammogram a year ago but have not had a recent colonoscopy.   Hospital Course:  PE (pulmonary embolism)  Unclear etiology, hypercoagulable state workup essentially unremarkable- except that protein S activity is low- ? Significance. Recommend OP hematology consultation in a couple of months to evaluate for etiology of hypercoagulable state. Discussed  this with the patient, and she will likely the hematology referral as an outpatient. CT chest angio chest confirmed pulmonary embolism in right lower lobe segmental branch.  LE doppler negative for DVT  INR within therapeutic range today, discontinue heparin and continue Coumadin alone. Patient is ready for discharge today. Echo: mild LVH. EF 70%. No evidence of marked RV dysfunction. Initially tachycardic, resolved by the time of discharge. Breathing comfortable on room air.  Chest pain  Initially presumed ot be secondary to pulmonary embolism. Patient continued to complain of chest pain so cardiac enzymes were cycled and negative.  2 D ECHO on this admission showed EF of 70%  Nitroglycerin PRN Chest pain is pleuritic- lower substernal Hypokalemia  Resolved  Unspecified hypothyroidism  Continue levothyroxine 137 mcg a day  TSH was normal Anemia  Hemoglobin was within normal limits on admission  Procedures:  2D echo - Left ventricle: Tachycardia during the study. The cavity size was normal. Wall thickness was increased in a pattern of mild LVH. The estimated ejection fraction was 70%. Wall motion was normal; there were no regional wall motion abnormalities. - Right ventricle: The RV is not seen well. However, there does not appear to be marked RV dysfunction. - Pulmonary arteries: PA peak pressure: 37mm Hg (S). - Inferior vena cava: The vessel was dilated; the respirophasic diameter changes were blunted (< 50%); findings are consistent with elevated central venous pressure.  Consultations:  none  Discharge Exam: Filed Vitals:   08/05/12 0416 08/05/12 1444 08/05/12 1926 08/06/12 0509  BP: 129/71 147/88 143/79 124/70  Pulse: 73 80  92 80  Temp: 98.5 F (36.9 C) 99 F (37.2 C) 99.1 F (37.3 C) 98.6 F (37 C)  TempSrc: Oral Oral Oral Oral  Resp: 16 18 16 16   Height:      Weight:      SpO2: 99% 94% 100% 100%   General: NAD, comfortable Cardiovascular: RRR without MRG,  no tachycardia Respiratory: CTA biL, on room ait  Discharge Instructions    Medication List    TAKE these medications       famotidine-calcium carbonate-magnesium hydroxide 10-800-165 MG Chew  Commonly known as:  PEPCID COMPLETE  Chew 1 tablet by mouth 2 (two) times daily.     levothyroxine 137 MCG tablet  Commonly known as:  SYNTHROID, LEVOTHROID  Take 137 mcg by mouth daily.     naproxen 500 MG tablet  Commonly known as:  NAPROSYN  Take 500 mg by mouth 2 (two) times daily with a meal.     penicillin v potassium 500 MG tablet  Commonly known as:  VEETID  Take 500 mg by mouth 3 (three) times daily. Started 3.3.14 for 7 days     warfarin 5 MG tablet  Commonly known as:  COUMADIN  Take 1 tablet (5 mg total) by mouth daily.        The results of significant diagnostics from this hospitalization (including imaging, microbiology, ancillary and laboratory) are listed below for reference.    Significant Diagnostic Studies: Dg Chest 2 View  07/31/2012  *RADIOLOGY REPORT*  Clinical Data: Chest pain, back pain  CHEST - 2 VIEW  Comparison: None.  Findings: Bibasilar linear opacities consistent with atelectasis versus scarring.  Cardiac and mediastinal contours within normal limits.  Inspiratory volumes are low.  No edema, pleural effusion or pneumothorax.  No acute osseous abnormality.  IMPRESSION: Low inspiratory volumes with bibasilar atelectasis versus scarring.   Original Report Authenticated By: Malachy Moan, M.D.    Ct Angio Chest Pe W/cm &/or Wo Cm  07/31/2012  *RADIOLOGY REPORT*  Clinical Data: Shortness of breath, chest pain.  CT ANGIOGRAPHY CHEST  Technique:  Multidetector CT imaging of the chest using the standard protocol during bolus administration of intravenous contrast. Multiplanar reconstructed images including MIPs were obtained and reviewed to evaluate the vascular anatomy.  Contrast: OMNIPAQUE IOHEXOL 350 MG/ML SOLN  Comparison: 07/31/2012 radiograph   Findings: There is a pulmonary arterial branch filling defect to the right lower lobe (series 6 image 134).  Normal caliber aorta.  Heart size upper normal.  No pleural or pericardial effusion.  Limited images through the upper abdomen show no acute finding.  Central airways are patent.  There are patchy airspace opacities within the lower lobes, lingula, and middle lobe.  No pneumothorax. Calcifications along the right lower lobe are nonspecific.  No acute osseous finding.  IMPRESSION: Acute pulmonary embolism within a right lower lobe segmental branch.  Patchy airspace opacities within the lower lobes, middle lobe, and lingula.  May reflect atelectasis or infection.  Infarction not entirely exclude however felt to be less likely.  Critical Value/emergent results were called by telephone at the time of interpretation on 07/31/2012 at 11:15 p.m. to [facility], who verbally acknowledged these results.   Original Report Authenticated By: Jearld Lesch, M.D.     Microbiology: Recent Results (from the past 240 hour(s))  URINE CULTURE     Status: None   Collection Time    08/01/12  4:54 PM      Result Value Range Status   Specimen  Description URINE, RANDOM   Final   Special Requests Normal   Final   Culture  Setup Time 08/02/2012 01:43   Final   Colony Count NO GROWTH   Final   Culture NO GROWTH   Final   Report Status 08/03/2012 FINAL   Final     Labs: Basic Metabolic Panel:  Recent Labs Lab 07/31/12 2108 08/01/12 0620 08/02/12 0450 08/03/12 0445  NA 143 141 142 141  K 3.0* 3.4* 3.3* 3.5  CL 102 105 107 104  CO2 28 27 25 28   GLUCOSE 117* 112* 90 87  BUN 11 9 7 6   CREATININE 0.61 0.62 0.60 0.59  CALCIUM 9.6 8.7 8.7 8.9  MG  --  1.8  --   --   PHOS  --  3.6  --   --    Liver Function Tests:  Recent Labs Lab 07/31/12 2108 08/01/12 0620  AST 58* 32  ALT 52* 38*  ALKPHOS 94 80  BILITOT 0.6 0.6  PROT 8.5* 7.0  ALBUMIN 4.1 3.2*    Recent Labs Lab 07/31/12 2108   LIPASE 24   CBC:  Recent Labs Lab 07/31/12 2108  08/02/12 0450 08/03/12 0445 08/04/12 0615 08/05/12 0552 08/06/12 0410  WBC 11.1*  < > 8.7 8.8 7.3 7.6 8.5  NEUTROABS 9.2*  --   --   --   --   --   --   HGB 12.8  < > 10.6* 11.2* 11.2* 12.3 12.2  HCT 36.3  < > 31.1* 32.2* 31.7* 35.5* 34.5*  MCV 85.4  < > 87.4 86.6 85.9 84.5 85.0  PLT 195  < > 183 206 238 305 306  < > = values in this interval not displayed. Cardiac Enzymes:  Recent Labs Lab 08/04/12 0806 08/04/12 1310 08/04/12 1830  TROPONINI <0.30 <0.30 <0.30     Signed:  Pamella Pert  [facility] 08/06/2012, 9:00 AM

## 2012-08-06 NOTE — Progress Notes (Signed)
ANTICOAGULATION CONSULT NOTE - Follow Up Consult  Pharmacy Consult for Coumadin Indication: RLL PE  No Known Allergies  Patient Measurements: Height: 5\' 6"  (167.6 cm) Weight: 200 lb 13.4 oz (91.1 kg) IBW/kg (Calculated) : 59.3 Heparin Dosing Weight: 79kg  Vital Signs: Temp: 98.6 F (37 C) (03/18 0509) Temp src: Oral (03/18 0509) BP: 124/70 mmHg (03/18 0509) Pulse Rate: 80 (03/18 0509)  Labs:  Recent Labs  08/04/12 0615 08/04/12 0806 08/04/12 1310 08/04/12 1830 08/05/12 0552 08/06/12 0410  HGB 11.2*  --   --   --  12.3 12.2  HCT 31.7*  --   --   --  35.5* 34.5*  PLT 238  --   --   --  305 306  LABPROT 17.5*  --   --   --  18.7* 23.8*  INR 1.48  --   --   --  1.62* 2.24*  HEPARINUNFRC 0.29*  --   --  0.37 0.40 0.56  TROPONINI  --  <0.30 <0.30 <0.30  --   --     Estimated Creatinine Clearance: 92.4 ml/min (by C-G formula based on Cr of 0.59).   Medications:  Heparin d/c'd  Assessment: Cynthia Villegas continues on heparin and coumadin day #6 overlap for PE. Heparin level is therapeutic. INR therapeutic today and transitioning off ufh to lovenox for 12 hours. D/c home today, estimate patient will need 7.5mg  daily.   Goal of Therapy:   INR 2-3 Monitor platelets by anticoagulation protocol: Yes   Plan:  Coumadin 7.5mg  today and f/u daily protime.  Verlene Mayer, PharmD, BCPS Pager [phone]  08/06/2012 9:07 AM

## 2012-08-13 ENCOUNTER — Telehealth: Payer: Self-pay | Admitting: Oncology

## 2012-08-13 NOTE — Telephone Encounter (Signed)
LVOM FOR PT TO RETURN CALL IN RE TO APPT.  °

## 2012-08-14 ENCOUNTER — Telehealth: Payer: Self-pay | Admitting: Oncology

## 2012-08-14 NOTE — Telephone Encounter (Signed)
C/D 08/14/12 for appt.09/10/12

## 2012-08-14 NOTE — Telephone Encounter (Signed)
S/W PT IN RE NP APPT 04/22 @ 10:30 W/DR. SHADAD. REFERRING DR. CAMMIE FULP DX- S/P PE WITH LOW PROTEIN S  WELCOME PACKET MAILED.

## 2012-08-15 NOTE — ED Provider Notes (Signed)
Medical screening examination/treatment/procedure(s) were conducted as a shared visit with non-physician practitioner(s) and myself.  I personally evaluated the patient during the encounter.  Patient evaluated for pleuritic chest pain. Symptoms present for 2 days. Patient indicates that her symptoms are worsening. Examination revealed mild distress. Workup revealed evidence of pulmonary embolism. Patient admitted to the hospital for definitive treatment.  Gilda Crease, MD 08/15/12 [phone]

## 2012-09-04 ENCOUNTER — Telehealth: Payer: Self-pay | Admitting: Oncology

## 2012-09-04 NOTE — Telephone Encounter (Signed)
LVOM FOR PT TO RETURN CALL IN RE TO APPT CHANGE.

## 2012-09-10 ENCOUNTER — Ambulatory Visit (HOSPITAL_BASED_OUTPATIENT_CLINIC_OR_DEPARTMENT_OTHER): Payer: 59 | Admitting: Oncology

## 2012-09-10 ENCOUNTER — Telehealth: Payer: Self-pay | Admitting: Oncology

## 2012-09-10 ENCOUNTER — Ambulatory Visit: Payer: 59

## 2012-09-10 ENCOUNTER — Other Ambulatory Visit: Payer: 59 | Admitting: Lab

## 2012-09-10 ENCOUNTER — Encounter: Payer: Self-pay | Admitting: Oncology

## 2012-09-10 ENCOUNTER — Ambulatory Visit: Payer: 59 | Admitting: Oncology

## 2012-09-10 VITALS — BP 147/86 | HR 81 | Temp 98.2°F | Resp 18 | Ht 66.0 in | Wt 202.0 lb

## 2012-09-10 DIAGNOSIS — I2699 Other pulmonary embolism without acute cor pulmonale: Secondary | ICD-10-CM

## 2012-09-10 DIAGNOSIS — E039 Hypothyroidism, unspecified: Secondary | ICD-10-CM

## 2012-09-10 DIAGNOSIS — Z7901 Long term (current) use of anticoagulants: Secondary | ICD-10-CM

## 2012-09-10 DIAGNOSIS — Z86718 Personal history of other venous thrombosis and embolism: Secondary | ICD-10-CM

## 2012-09-10 NOTE — Progress Notes (Signed)
Note dictated

## 2012-09-10 NOTE — Progress Notes (Signed)
Checked in new pt with no financial concerns. °

## 2012-09-10 NOTE — Telephone Encounter (Signed)
PT ARRIVED TODAY THINK ABOUT WAS TODAY. VM WAS FOR PT ABOUT CHANGE IN APPT ON 04/16 APPT HAS BEEN R/S TODAY DUE TO AN OPEN NP SLOT.

## 2012-09-11 NOTE — Progress Notes (Signed)
CC:   Cammie Fulp, MD  REASON FOR CONSULTATION:  Pulmonary embolus.  HISTORY OF PRESENT ILLNESS:  Ms. Riordan is a pleasant 52 year old woman with a past medical history significant for hyperthyroidism diagnosed close to 10 years ago, and she has had a thyroid ablation and currently hypothyroid on a levothyroxine supplementation.  The patient was in her normal state of health, where she presented acutely with chest pain in March 2014.  She initially had nausea and vomiting and pain under her breast, and treated with a urinary tract infection initially.  The pain persisted and subsequently she presented to the hospital with these complaints.  She underwent a CT scan on 07/31/2012 which showed an acute pulmonary embolism within the right lower lobe segmental branch and ended up being hospitalized between 07/31/2012 and 08/06/2012.  At that time she was discharged home on warfarin with a therapeutic INR.  During her hospitalization she did have a hypercoagulable workup that was done and showed that she has really no anticardiolipin antibodies detected. Her lupus anticoagulant was not detected.  She had a normal factor V Leiden mutation.  Prothrombin gene mutation and antithrombin 3 levels were normal.  Beta-2 glycoprotein all normal.  Her protein C total activity was slightly low at 54, so is her protein S at 57; but that was drawn in the acute clot setting.  The patient is stabilized on Coumadin levels and has felt well since her discharge.  She resumed her activities of daily living and had not had any hospitalization or illnesses.  Leading up to this episode she denied any prolonged immobilization.  She had not had any orthopedic surgery.  She had not had any estrogen replacement therapy.  She did not have any particular trauma.  The only provoking symptoms or symptoms that possibly could have contributed are her immobility at work, where she sits about 6 hours straight at her desk.   Since her discharge she has tried to be changing that.  Other than that, she had not had any long travel, has not had particularly any trauma.  REVIEW OF SYSTEMS:  Does not report any headaches, blurry vision, double vision.  Does not report any motor or sensory neuropathy.  Does not report any  alteration in mental status.  Does not report any psychiatric issues or depression.  Does not report any fever, chills, sweats.  Does not report any cough, hemoptysis, hematemesis.  No nausea, vomiting, abdominal pain, hematochezia, melena, or genitourinary complaints.  Rest of the review of systems is unremarkable.  PAST MEDICAL HISTORY:  Really unremarkable.  She is status post hysterectomy.  She has a history of Graves disease, and currently hypothyroidism.  MEDICATIONS:  She is on Synthroid.  She is on warfarin, Claritin, and Protonix.  ALLERGIES:  None.  SOCIAL HISTORY:  She is single, lives alone.  She has 1 daughter. Denied any alcohol or tobacco abuse.  She has not had any history of miscarriages.  FAMILY HISTORY:  Mother had breast cancer.  She also had a postoperative DVT.  Father has glaucoma.  She has 2 sisters; 1 had a miscarriage, but no other blood clots.  PHYSICAL EXAMINATION:  General:  Alert, awake, pleasant woman, appeared in no active distress.  Vital Signs:  Her blood pressure is 147/86, pulse 81, respiration 18, temperature is 98.  She weighs 202 pounds. ECOG performance status 0.  HEENT:  Head is normocephalic, atraumatic. Pupils equal, round, reactive to light.  Oral mucosa moist and pink. Neck:  Supple,  without lymphadenopathy.  Heart:  Regular rate and rhythm.  S1 and S2.  Lungs:  Clear to auscultation.  No crackle, wheeze, or dullness to percussion.  Abdomen:  Soft, nontender.  No hepatosplenomegaly.  Extremities:  No clubbing, cyanosis, or edema. Neurologic:  Intact motor, sensory, and deep tendon reflexes.  LABORATORY DATA:  Discussed in the history of  present illness.  ASSESSMENT AND PLAN:  This is a 52 year old woman with a recent diagnosis of a pulmonary embolus.  It appears to be unprovoked.  She did have a history of mild obesity as well as immobilization at her work. 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  She has really no other provoking symptoms at this time.  Her hypercoagulable workup was really unremarkable.  Her low protein S and protein C are very mild and are probably related to the acute clot setting.  I doubt this is an inherited thrombophilia.  Her family history is really also unremarkable despite a history of DVT in her mother, which is clearly postoperative after a knee operation.  At this point I do not think there is an occult malignancy.  At this point she has had a CT scan, as well as she is up to speed on her cancer screening.  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  After 6 months I think it will be reasonable to consider coming off anticoagulation, and probably a low-dose aspirin 81 mg for preventative purposes would be reasonable as well.  However, if she develops another blood clot, then probably lifetime anticoagulation would be warranted.  I also counseled her about continued age-appropriate cancer screening as well as the importance of weight __________gain, active hydration, and continuing to be active and try to limit her immobility times for at least less than 2 hours, and stretching exercises to try to prevent any stagnant blood flow in her lower extremities.  I have not scheduled Ms. Therrell a routine followup, but I will be happy to see her in the future as needed.    ______________________________ Benjiman Core, M.D. FNS/MEDQ  D:  09/10/2012  T:  09/11/2012  Job:  [id number]

## 2012-10-02 ENCOUNTER — Ambulatory Visit: Payer: 59

## 2012-10-02 ENCOUNTER — Ambulatory Visit: Payer: 59 | Admitting: Oncology

## 2012-10-02 ENCOUNTER — Other Ambulatory Visit: Payer: 59 | Admitting: Lab

## 2017-12-09 ENCOUNTER — Emergency Department (HOSPITAL_COMMUNITY)
Admission: EM | Admit: 2017-12-09 | Discharge: 2017-12-09 | Disposition: A | Discharge status: Home / Self Care | Payer: 59 | Attending: Emergency Medicine | Admitting: Emergency Medicine

## 2017-12-09 ENCOUNTER — Encounter (HOSPITAL_COMMUNITY): Payer: Self-pay | Admitting: Emergency Medicine

## 2017-12-09 ENCOUNTER — Emergency Department (HOSPITAL_COMMUNITY): Payer: 59

## 2017-12-09 DIAGNOSIS — Z79899 Other long term (current) drug therapy: Secondary | ICD-10-CM | POA: Diagnosis not present

## 2017-12-09 DIAGNOSIS — E039 Hypothyroidism, unspecified: Secondary | ICD-10-CM | POA: Diagnosis not present

## 2017-12-09 DIAGNOSIS — Z7982 Long term (current) use of aspirin: Secondary | ICD-10-CM | POA: Insufficient documentation

## 2017-12-09 DIAGNOSIS — R200 Anesthesia of skin: Secondary | ICD-10-CM | POA: Diagnosis not present

## 2017-12-09 DIAGNOSIS — E876 Hypokalemia: Secondary | ICD-10-CM | POA: Diagnosis not present

## 2017-12-09 LAB — URINALYSIS, ROUTINE W REFLEX MICROSCOPIC
BILIRUBIN URINE: NEGATIVE
Glucose, UA: NEGATIVE mg/dL
KETONES UR: NEGATIVE mg/dL
NITRITE: NEGATIVE
PH: 7 (ref 5.0–8.0)
Protein, ur: NEGATIVE mg/dL
SPECIFIC GRAVITY, URINE: 1.014 (ref 1.005–1.030)

## 2017-12-09 LAB — CBC
HEMATOCRIT: 39.8 % (ref 36.0–46.0)
Hemoglobin: 13.4 g/dL (ref 12.0–15.0)
MCH: 29.8 pg (ref 26.0–34.0)
MCHC: 33.7 g/dL (ref 30.0–36.0)
MCV: 88.6 fL (ref 78.0–100.0)
Platelets: 222 10*3/uL (ref 150–400)
RBC: 4.49 MIL/uL (ref 3.87–5.11)
RDW: 12.8 % (ref 11.5–15.5)
WBC: 8.7 10*3/uL (ref 4.0–10.5)

## 2017-12-09 LAB — VITAMIN B12: VITAMIN B 12: 447 pg/mL (ref 180–914)

## 2017-12-09 LAB — BASIC METABOLIC PANEL
Anion gap: 8 (ref 5–15)
BUN: 9 mg/dL (ref 6–20)
CALCIUM: 9 mg/dL (ref 8.9–10.3)
CO2: 31 mmol/L (ref 22–32)
Chloride: 103 mmol/L (ref 98–111)
Creatinine, Ser: 0.78 mg/dL (ref 0.44–1.00)
GFR calc Af Amer: >60 mL/min (ref >60)
Glucose, Bld: 128 mg/dL — ABNORMAL HIGH (ref 70–99)
POTASSIUM: 3.1 mmol/L — AB (ref 3.5–5.1)
SODIUM: 142 mmol/L (ref 135–145)

## 2017-12-09 LAB — TSH: TSH: 2.364 u[IU]/mL (ref 0.350–4.500)

## 2017-12-09 MED ORDER — ONDANSETRON 4 MG PO TBDP
4.0000 mg | ORAL_TABLET | Freq: Once | ORAL | Status: DC | PRN
  Filled 2017-12-09: qty 1

## 2017-12-09 MED ORDER — POTASSIUM CHLORIDE CRYS ER 20 MEQ PO TBCR
40.0000 meq | EXTENDED_RELEASE_TABLET | Freq: Once | ORAL | Status: AC
  Administered 2017-12-09: 40 meq via ORAL
  Filled 2017-12-09: qty 2

## 2017-12-09 NOTE — ED Notes (Signed)
Pt ambulatory to restroom with steady gait.

## 2017-12-09 NOTE — ED Notes (Signed)
Pt updated on plan of care

## 2017-12-09 NOTE — ED Notes (Signed)
Patient transported to MRI 

## 2017-12-09 NOTE — ED Notes (Addendum)
Pt reports numbness in her legs bilaterally. Denies any "weakness" but states her sensation is different. Strength in legs is good bilaterally. She is aox4.

## 2017-12-09 NOTE — Discharge Instructions (Addendum)
There are some lab results that are still pending. Your MRIs did not have abnormalities that would definitively explain your symptoms. Please follow-up with your primary care provider within the next 2 weeks. Your potassium was low, please have this reevaluated by your PCP.  Follow-up with a neurologist within the next 2 to 4 weeks. Return to the ED should you have worsening symptoms, the addition of weakness, difficulty breathing, difficulty swallowing, or any other major concerns.

## 2017-12-09 NOTE — ED Notes (Signed)
Patient ambulatory to bathroom with steady gait at this time 

## 2017-12-09 NOTE — ED Provider Notes (Signed)
[facility] EMERGENCY DEPARTMENT Provider Note   CSN: [id number] Arrival date & time: 12/09/17  0002     History   Chief Complaint Chief Complaint  Patient presents with  . Numbness    HPI Cynthia Villegas is a 57 y.o. female.  HPI   Cynthia Villegas is a 57 y.o. female, with a history of Graves' disease, presenting to the ED with numbness noted a little over a week ago. Numbness was perianal and in the genitals.  Was seen [facility] ED on July 14, Lumbar MRI reportedly showed some "narrowing." Prescribed prednisone. Since then has had progressive numbness in the bilateral lower extremities. Had some tingling in the bilateral hands this past week, but this has resolved.   Came in today because she had difficulty having a bowel movement last night.   Denies weakness, trauma, fever, abdominal pain, back pain, shortness of breath, CP, dizziness, headaches, difficulty speaking or swallowing, recent illness, rashes, urinary retention, or any other complaints.    Past Medical History:  Diagnosis Date  . Graves disease     Patient Active Problem List   Diagnosis Date Noted  . Chest pain 08/04/2012  . PE (pulmonary embolism) 08/01/2012  . Hypokalemia 08/01/2012  . Unspecified hypothyroidism 08/01/2012  . Anemia 08/01/2012    History reviewed. No pertinent surgical history.   OB History   None      Home Medications    Prior to Admission medications   Medication Sig Start Date End Date Taking? Authorizing Provider  acetaminophen (TYLENOL) 650 MG CR tablet Take 650 mg by mouth every 8 (eight) hours as needed for pain. As needed for discomfort   Yes [provider]  aspirin EC 81 MG tablet Take 81 mg by mouth daily.   Yes [provider]  levothyroxine (SYNTHROID, LEVOTHROID) 137 MCG tablet Take 100 mcg by mouth daily before breakfast.    Yes [provider]  methylPREDNISolone (MEDROL DOSEPAK) 4 MG TBPK tablet Take 4-24 mg by mouth  See admin instructions. Take 6 tablets (24 mg totally) on day 1; decrease by 1 tablet each day for total 6 days 12/03/17 12/09/17 Yes [provider]    Family History Family History  Problem Relation Age of Onset  . Breast cancer Mother   . Heart disease Father     Social History Social History   Tobacco Use  . Smoking status: Never Smoker  . Smokeless tobacco: Never Used  Substance Use Topics  . Alcohol use: Yes    Comment: occasional  . Drug use: No     Allergies   Patient has no known allergies.   Review of Systems Review of Systems  Constitutional: Negative for chills and fever.  Respiratory: Negative for shortness of breath.   Cardiovascular: Negative for chest pain.  Gastrointestinal: Negative for abdominal pain, diarrhea, nausea and vomiting.       Difficulty having BMs  Genitourinary: Negative for difficulty urinating.  Musculoskeletal: Negative for back pain and neck pain.  Neurological: Positive for numbness. Negative for dizziness, syncope, weakness, light-headedness and headaches.       Groin and LE numbness  All other systems reviewed and are negative.    Physical Exam Updated Vital Signs BP (!) 157/80   Pulse (!) 58   Temp 98.2 F (36.8 C) (Oral)   Resp (!) 21   Wt 91.6 kg (202 lb)   SpO2 100%   BMI 32.60 kg/m   Physical Exam  Constitutional: She  appears well-developed and well-nourished. No distress.  HENT:  Head: Normocephalic and atraumatic.  Eyes: Conjunctivae are normal.  Neck: Neck supple.  Cardiovascular: Normal rate, regular rhythm, normal heart sounds and intact distal pulses.  Pulmonary/Chest: Effort normal and breath sounds normal. No respiratory distress.  Abdominal: Soft. There is no tenderness. There is no guarding.  Genitourinary: Rectal exam shows anal tone abnormal.  Genitourinary Comments: Decreased sensation bilateral buttocks, groin, and perianal region.  Decreased rectal tone. PA student, Janelle Flooraomi, served as  chaperone during exam.  Musculoskeletal: She exhibits no edema.  Lymphadenopathy:    She has no cervical adenopathy.  Neurological: She is alert. A sensory deficit is present.  Reflex Scores:      Patellar reflexes are 2+ on the right side and 2+ on the left side.      Achilles reflexes are 2+ on the right side and 2+ on the left side. Sensation to light touch grossly intact in bilateral upper extremities. Decreased sensation in lower extremities bilaterally, upper and lower leg nerve distributions.  Decreased sensation extends into the abdomen, just below umbilicus. No noted speech deficits. No aphasia. Patient handles oral secretions without difficulty. No noted swallowing defects.  Equal grip strength bilaterally. Strength 5/5 in the upper extremities. Strength 5/5 with flexion and extension of the hips, knees, and ankles bilaterally.  Patellar DTRs 2+ bilaterally Negative Romberg. No gait disturbance.  Coordination intact including heel to shin and finger to nose.  Cranial nerves III-XII grossly intact.  No facial droop.   Skin: Skin is warm and dry. She is not diaphoretic.  Psychiatric: She has a normal mood and affect. Her behavior is normal.  Nursing note and vitals reviewed.    ED Treatments / Results  Labs (all labs ordered are listed, but only abnormal results are displayed) Labs Reviewed  BASIC METABOLIC PANEL - Abnormal; Notable for the following components:      Result Value   Potassium 3.1 (*)    Glucose, Bld 128 (*)    All other components within normal limits  URINALYSIS, ROUTINE W REFLEX MICROSCOPIC - Abnormal; Notable for the following components:   Hgb urine dipstick SMALL (*)    Leukocytes, UA LARGE (*)    Bacteria, UA RARE (*)    All other components within normal limits  CBC  TSH  VITAMIN B12  RPR  COPPER, SERUM  HIV ANTIBODY (ROUTINE TESTING)  CBG MONITORING, ED    EKG EKG Interpretation  Date/Time:  Sunday December 09 2017 01:02:20  EDT Ventricular Rate:  64 PR Interval:  186 QRS Duration: 86 QT Interval:  406 QTC Calculation: 418 R Axis:   22 Text Interpretation:  Normal sinus rhythm Possible Left atrial enlargement Borderline ECG tachycardia resolved compared to 2014 T wave changes no longer present Confirmed by Pricilla LovelessGoldston, Scott [phone]) on 12/09/2017 7:33:43 AM   Radiology Mr Thoracic Spine Wo Contrast  Result Date: 12/09/2017 CLINICAL DATA:  Back pain, cauda equina syndrome suspected. EXAM: MRI THORACIC AND LUMBAR SPINE WITHOUT CONTRAST TECHNIQUE: Multiplanar and multiecho pulse sequences of the thoracic and lumbar spine were obtained without intravenous contrast. COMPARISON:  None. FINDINGS: MRI THORACIC SPINE FINDINGS Alignment:  Physiologic. Vertebrae: No fracture, evidence of discitis, or bone lesion. Numbering of the thoracic spine was account placed by counting down from the odontoid. Cord:  Normal signal and morphology. Paraspinal and other soft tissues: Negative. Disc levels: Minor disc protrusions at T2-3, T6-7, T7-8, T8-9, T10-11, T11-12, and T12-L1 do not result in significant stenosis or cord  compression. There is mild foraminal narrowing at T10-T11 to the LEFT, which could irritate the T10 nerve root. MRI LUMBAR SPINE FINDINGS Segmentation: Transitional anatomy. Numbering scheme is continued downward from the thoracic spine. L5-S1 disc spaces transitional. Alignment: Physiologic, except for trace anterolisthesis L4-5, facet mediated. Vertebrae:  No worrisome osseous lesion.  Congenital short pedicles. Conus medullaris and cauda equina: Conus extends to the lower T12 level. Conus and cauda equina appear normal. Paraspinal and other soft tissues: Unremarkable. Disc levels: L1-L2:  Normal. L2-L3:  Disc desiccation.  Facet arthropathy.  No impingement. L3-L4: Disc desiccation. Central protrusion. Short pedicles. Posterior element hypertrophy. Moderate to severe stenosis. BILATERAL L4 neural impingement is possible. L4-L5:  Trace anterolisthesis. Annular bulge moderate stenosis. Short pedicles. Posterior element hypertrophy. Moderate stenosis. LEFT greater than RIGHT L5 neural impingement is possible. No significant foraminal narrowing. L5-S1: Transitional disc space. Central and rightward protrusion. Short pedicles. Posterior element hypertrophy. Mild RIGHT-sided stenosis. RIGHT S1 nerve root impingement is possible. IMPRESSION: MR THORACIC SPINE IMPRESSION MRI thoracic spine demonstrates multiple small disc protrusions, but no significant spinal stenosis, or cord compression. LEFT T10-11 foraminal narrowing, uncertain significance. No thoracic spine fracture, intrinsic cord abnormality, visible hydromyelia, or intraspinal mass lesion. MR LUMBAR SPINE IMPRESSION Transitional anatomy.  L5-S1 disc is hypoplastic. 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 Disc pathology, short pedicles, and posterior element hypertrophy contribute to possible subarticular zone narrowing at multiple levels. See discussion above. There are no areas of critical spinal stenosis to suggest cauda equina syndrome, although the degree of stenosis at L3-4 is moderate to severe. Electronically Signed   By: Elsie Stain M.D.   On: 12/09/2017 15:17   Mr Lumbar Spine Wo Contrast  Result Date: 12/09/2017 CLINICAL DATA:  Back pain, cauda equina syndrome suspected. EXAM: MRI THORACIC AND LUMBAR SPINE WITHOUT CONTRAST TECHNIQUE: Multiplanar and multiecho pulse sequences of the thoracic and lumbar spine were obtained without intravenous contrast. COMPARISON:  None. FINDINGS: MRI THORACIC SPINE FINDINGS Alignment:  Physiologic. Vertebrae: No fracture, evidence of discitis, or bone lesion. Numbering of the thoracic spine was account placed by counting down from the odontoid. Cord:  Normal signal and morphology. Paraspinal and other soft tissues: Negative. Disc levels: Minor disc protrusions at T2-3, T6-7, T7-8, T8-9,  T10-11, T11-12, and T12-L1 do not result in significant stenosis or cord compression. There is mild foraminal narrowing at T10-T11 to the LEFT, which could irritate the T10 nerve root. MRI LUMBAR SPINE FINDINGS Segmentation: Transitional anatomy. Numbering scheme is continued downward from the thoracic spine. L5-S1 disc spaces transitional. Alignment: Physiologic, except for trace anterolisthesis L4-5, facet mediated. Vertebrae:  No worrisome osseous lesion.  Congenital short pedicles. Conus medullaris and cauda equina: Conus extends to the lower T12 level. Conus and cauda equina appear normal. Paraspinal and other soft tissues: Unremarkable. Disc levels: L1-L2:  Normal. L2-L3:  Disc desiccation.  Facet arthropathy.  No impingement. L3-L4: Disc desiccation. Central protrusion. Short pedicles. Posterior element hypertrophy. Moderate to severe stenosis. BILATERAL L4 neural impingement is possible. L4-L5: Trace anterolisthesis. Annular bulge moderate stenosis. Short pedicles. Posterior element hypertrophy. Moderate stenosis. LEFT greater than RIGHT L5 neural impingement is possible. No significant foraminal narrowing. L5-S1: Transitional disc space. Central and rightward protrusion. Short pedicles. Posterior element hypertrophy. Mild RIGHT-sided stenosis. RIGHT S1 nerve root impingement is possible. IMPRESSION: MR THORACIC SPINE IMPRESSION MRI thoracic spine demonstrates multiple small disc protrusions, but no significant spinal stenosis, or cord compression. LEFT T10-11 foraminal narrowing, uncertain significance. No thoracic spine  fracture, intrinsic cord abnormality, visible hydromyelia, or intraspinal mass lesion. MR LUMBAR SPINE IMPRESSION Transitional anatomy.  L5-S1 disc is hypoplastic. 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 Disc pathology, short pedicles, and posterior element hypertrophy contribute to possible subarticular zone narrowing at multiple levels. See  discussion above. There are no areas of critical spinal stenosis to suggest cauda equina syndrome, although the degree of stenosis at L3-4 is moderate to severe. Electronically Signed   By: Elsie Stain M.D.   On: 12/09/2017 15:17    Procedures Procedures (including critical care time)  Medications Ordered in ED Medications  ondansetron (ZOFRAN-ODT) disintegrating tablet 4 mg (has no administration in time range)  potassium chloride SA (K-DUR,KLOR-CON) CR tablet 40 mEq (40 mEq Oral Given 12/09/17 1001)     Initial Impression / Assessment and Plan / ED Course  I have reviewed the triage vital signs and the nursing notes.  Pertinent labs & imaging results that were available during my care of the patient were reviewed by me and considered in my medical decision making (see chart for details).  Clinical Course as of Dec 10 1551  Sun Dec 09, 2017  0935 Spoke with Dr. Wilford Corner, neurologist. Recommends emergent MR lumbar and thoracic spine without contrast.   [SJ]  1000 Patient has a history of the same.  She will follow-up with her PCP on this matter.  Potassium(!): 3.1 [SJ]  1533 Spoke with Dr. Wilford Corner again to discuss MRI results. States patient can follow up with PCP within 2 weeks, neurology within 2-4 weeks. Requests patient have B12, TSH, RPR, and Copper level drawn prior to discharge.    [SJ]    Clinical Course User Index [SJ] Elianny Buxbaum C, PA-C    Patient presents with decreased sensation in the lower extremities without lower extremity weakness.  She has no true urinary or stool tension.  No incontinence.  No lower back pain or trauma.  Multiple small abnormalities noted on MRIs, but none that would explain the patient's symptoms.  She will follow-up with her PCP and neurology.  Return precautions discussed.  Patient voices understanding of these instructions, accepts the plan, and is comfortable with discharge.  Findings and plan of care discussed with Pricilla Loveless, MD.   Vitals:    12/09/17 1000 12/09/17 1145 12/09/17 1245 12/09/17 1315  BP:    (!) 145/83  Pulse: (!) 56 (!) 56 (!) 57 (!) 50  Resp: 15 15 17 14   Temp:      TempSrc:      SpO2: 99% 98% 100% 98%  Weight:         Final Clinical Impressions(s) / ED Diagnoses   Final diagnoses:  Numbness  Hypokalemia    ED Discharge Orders    None       Concepcion Living 12/09/17 1553    Pricilla Loveless, MD 12/11/17 1542

## 2017-12-09 NOTE — ED Notes (Signed)
Pt ambulatory to room.

## 2017-12-09 NOTE — ED Notes (Signed)
Post void residual measured, 58 ml resulted.

## 2017-12-09 NOTE — ED Triage Notes (Signed)
Pt c/o numbness from knees to feet and fingertips. [facility].  Pt reports after BM tonight pt felt as if rectum became felt numb.  Pt states she has not felt well recently with decrease intake.  No facial droop noted, A & o

## 2017-12-10 LAB — RPR: RPR: NONREACTIVE

## 2017-12-10 LAB — HIV ANTIBODY (ROUTINE TESTING W REFLEX): HIV SCREEN 4TH GENERATION: NONREACTIVE

## 2017-12-11 LAB — COPPER, SERUM: COPPER: 93 ug/dL (ref 72–166)

## 2017-12-18 ENCOUNTER — Encounter: Payer: Self-pay | Admitting: *Deleted

## 2017-12-19 ENCOUNTER — Encounter: Payer: Self-pay | Admitting: Diagnostic Neuroimaging

## 2017-12-19 ENCOUNTER — Ambulatory Visit (INDEPENDENT_AMBULATORY_CARE_PROVIDER_SITE_OTHER): Payer: 59 | Admitting: Diagnostic Neuroimaging

## 2017-12-19 VITALS — BP 150/83 | HR 77 | Ht 66.0 in | Wt 200.0 lb

## 2017-12-19 DIAGNOSIS — R200 Anesthesia of skin: Secondary | ICD-10-CM

## 2017-12-19 DIAGNOSIS — R202 Paresthesia of skin: Secondary | ICD-10-CM

## 2017-12-19 NOTE — Progress Notes (Signed)
[facility]  PATIENT: Cynthia JensenJanice R Villegas DOB: 24-Aug-1960  REFERRING CLINICIAN: D Brooks HISTORY FROM: patient  REASON FOR VISIT: new consult    HISTORICAL  CHIEF COMPLAINT:  Chief Complaint  Patient presents with  . NP  Shon BatonBrooks,  2nd Opinion for Bilateral LE numbness and dyses    sx started 14 days ago.  Went to [facility], [place] and [facility]. Waist down numbness.  Groin better now, no incontinence.  Started PT yesterday.     HISTORY OF PRESENT ILLNESS:   57 year old female here for evaluation of numbness.   11/28/2017 patient noticed rectal numbness when using the bathroom.  Patient then [facility] and had MRI lumbar spine --> showed some degenerative changes and narrowing. Then had ER visit on 12/09/17, had MRI of the thoracic and lumbar spine --> moderate to severe spinal stenosis at L3-4 and moderate spinal stenosis at L4-5.  No thoracic spinal cord compression.  Patient followed up with orthopedic surgery clinic.  Patient was referred here for second opinion.  No bowel or bladder incontinence.  Groin and genital numbness has improved.  She still has low back pain and leg pain which is exacerbated by standing and walking.  Numbness in her feet and legs are stable.  Also has noted some numbness in her left hand.   REVIEW OF SYSTEMS: Full 14 system review of systems performed and negative with exception of: Numbness.  ALLERGIES: No Known Allergies  HOME MEDICATIONS: Outpatient Medications Prior to Visit  Medication Sig Dispense Refill  . acetaminophen (TYLENOL) 650 MG CR tablet Take 650 mg by mouth every 8 (eight) hours as needed for pain. As needed for discomfort    . aspirin EC 81 MG tablet Take 81 mg by mouth daily.    Marland Kitchen. levothyroxine (SYNTHROID, LEVOTHROID) 100 MCG tablet Take 100 mcg by mouth daily before breakfast.    . levothyroxine (SYNTHROID, LEVOTHROID) 137 MCG tablet Take 100 mcg by mouth daily before breakfast.      No facility-administered  medications prior to visit.     PAST MEDICAL HISTORY: Past Medical History:  Diagnosis Date  . Back pain   . Graves disease   . History of blood clots 2014    PAST SURGICAL HISTORY: Past Surgical History:  Procedure Laterality Date  . OTHER SURGICAL HISTORY     Hysterectomy    FAMILY HISTORY: Family History  Problem Relation Age of Onset  . Breast cancer Mother   . Dementia Mother   . Heart disease Father     SOCIAL HISTORY: Social History   Socioeconomic History  . Marital status: Single    Spouse name: Not on file  . Number of children: Not on file  . Years of education: Not on file  . Highest education level: Not on file  Occupational History  . Not on file  Social Needs  . Financial resource strain: Not on file  . Food insecurity:    Worry: Not on file    Inability: Not on file  . Transportation needs:    Medical: Not on file    Non-medical: Not on file  Tobacco Use  . Smoking status: Never Smoker  . Smokeless tobacco: Never Used  Substance and Sexual Activity  . Alcohol use: Yes    Comment: occasional  . Drug use: No  . Sexual activity: Not on file  Lifestyle  . Physical activity:    Days per week: Not on file    Minutes per  session: Not on file  . Stress: Not on file  Relationships  . Social connections:    Talks on phone: Not on file    Gets together: Not on file    Attends religious service: Not on file    Active member of club or organization: Not on file    Attends meetings of clubs or organizations: Not on file    Relationship status: Not on file  . Intimate partner violence:    Fear of current or ex partner: Not on file    Emotionally abused: Not on file    Physically abused: Not on file    Forced sexual activity: Not on file  Other Topics Concern  . Not on file  Social History Narrative   Lives home alone.  Has one child.  Working from home now, but normally works in office setting.  Education HS grad.       PHYSICAL  EXAM  GENERAL EXAM/CONSTITUTIONAL: Vitals:  Vitals:   12/19/17 1125  BP: (!) 150/83  Pulse: 77  Weight: 200 lb (90.7 kg)  Height: 5\' 6"  (1.676 m)     Body mass index is 32.28 kg/m. Wt Readings from Last 3 Encounters:  12/19/17 200 lb (90.7 kg)  12/09/17 202 lb (91.6 kg)  09/10/12 202 lb (91.6 kg)     Patient is in no distress; well developed, nourished and groomed; neck is supple  CARDIOVASCULAR:  Examination of carotid arteries is normal; no carotid bruits  Regular rate and rhythm, no murmurs  Examination of peripheral vascular system by observation and palpation is normal  EYES:  Ophthalmoscopic exam of optic discs and posterior segments is normal; no papilledema or hemorrhages  Visual Acuity Screening   Right eye Left eye Both eyes  Without correction:     With correction: 20/30 20/30      MUSCULOSKELETAL:  Gait, strength, tone, movements noted in Neurologic exam below  NEUROLOGIC: MENTAL STATUS:  No flowsheet data found.  awake, alert, oriented to person, place and time  recent and remote memory intact  normal attention and concentration  language fluent, comprehension intact, naming intact  fund of knowledge appropriate  CRANIAL NERVE:   2nd - no papilledema on fundoscopic exam  2nd, 3rd, 4th, 6th - pupils equal and reactive to light, visual fields full to confrontation, extraocular muscles intact, no nystagmus  5th - facial sensation symmetric  7th - facial strength symmetric  8th - hearing intact  9th - palate elevates symmetrically, uvula midline  11th - shoulder shrug symmetric  12th - tongue protrusion midline  MOTOR:   normal bulk and tone, full strength in the BUE, BLE  SENSORY:   normal and symmetric to light touch, pinprick, temperature, vibration; EXCEPT DECR PP, TEMP AND VIB IN LEFT FOOT / LEG (below knee)  COORDINATION:   finger-nose-finger, fine finger movements normal  REFLEXES:   deep tendon reflexes  BRISK; BUE 2+; KNEES 3 (R > L); ANKLES 2  GAIT/STATION:   narrow based gait; able to walk tandem     DIAGNOSTIC DATA (LABS, IMAGING, TESTING) - I reviewed patient records, labs, notes, testing and imaging myself where available.  Lab Results  Component Value Date   WBC 8.7 12/09/2017   HGB 13.4 12/09/2017   HCT 39.8 12/09/2017   MCV 88.6 12/09/2017   PLT 222 12/09/2017      Component Value Date/Time   NA 142 12/09/2017 0104   K 3.1 (L) 12/09/2017 0104   CL 103 12/09/2017 0104  CO2 31 12/09/2017 0104   GLUCOSE 128 (H) 12/09/2017 0104   BUN 9 12/09/2017 0104   CREATININE 0.78 12/09/2017 0104   CALCIUM 9.0 12/09/2017 0104   PROT 7.0 08/01/2012 0620   ALBUMIN 3.2 (L) 08/01/2012 0620   AST 32 08/01/2012 0620   ALT 38 (H) 08/01/2012 0620   ALKPHOS 80 08/01/2012 0620   BILITOT 0.6 08/01/2012 0620   GFRNONAA >60 12/09/2017 0104   GFRAA >60 12/09/2017 0104   No results found for: CHOL, HDL, LDLCALC, LDLDIRECT, TRIG, CHOLHDL No results found for: ZOXW9U Lab Results  Component Value Date   VITAMINB12 447 12/09/2017   Lab Results  Component Value Date   TSH 2.364 12/09/2017    12/09/17 MR THORACIC SPINE [I reviewed images myself and agree with interpretation. -VRP]  - MRI thoracic spine demonstrates multiple small disc protrusions, but no significant spinal stenosis, or cord compression. LEFT T10-11 foraminal narrowing, uncertain significance. - No thoracic spine fracture, intrinsic cord abnormality, visible hydromyelia, or intraspinal mass lesion.  12/09/17 MR LUMBAR SPINE [I reviewed images myself and agree with interpretation. L3-4 moderate-severe spinal stenosis; L4-5 moderate spinal stenosis. -VRP]  - Transitional anatomy.  L5-S1 disc is hypoplastic. 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 Disc pathology, short pedicles, and posterior element hypertrophy contribute to possible subarticular zone narrowing at  multiple levels. See discussion above. - There are no areas of critical spinal stenosis to suggest cauda equina syndrome, although the degree of stenosis at L3-4 is moderate to severe.    ASSESSMENT AND PLAN  57 y.o. year old female here with new onset numbness from mid torso down to bilateral lower extremities on November 28, 2016 associated with genital and groin numbness, now gradually improving.  Neurologic examination notable for decreased sensation in the lower extremities, brisk reflexes at the knees, and unsteady gait.  MRI of the thoracic spine was reviewed and no evidence of intrinsic spinal cord lesions.  MRI of the lumbar spine shows significant spinal stenosis which could account for patient's gait difficulty.  However patient does report numbness in left hand and resolving perineal numbness which are otherwise not well explained.  Therefore we will proceed with further work-up with additional CNS imaging studies to rule out demyelinating disease or other autoimmune or inflammatory conditions.  This time we will do the studies with and without IV contrast.  Ddx: CNS autoimmune, inflamm, demyelinating, vs lumbar spinal stenosis   1. Numbness and tingling      PLAN:  - MRI brain, cervical and thoracic (with and without) --> rule out inflam, autoimmune, demyelinating dz  - continue PT  - if above testing is negative, then continue lumbar spinal stenosis tx per Dr. Shon Baton  Orders Placed This Encounter  Procedures  . MR BRAIN W WO CONTRAST  . MR CERVICAL SPINE W WO CONTRAST  . MR THORACIC SPINE W WO CONTRAST  . Ceruloplasmin  . Neuromyelitis optica autoab, IgG   Return pending test results .    Suanne Marker, MD 12/19/2017, 12:08 PM Certified in Neurology, Neurophysiology and Neuroimaging  [facility] [address] [place], [place] [address] [phone]

## 2017-12-19 NOTE — Patient Instructions (Signed)
-   MRI brain, cervical and thoracic (with and without) --> rule out inflam, autoimmune, demyelinating dz  - lab testing   - continue PT  - if above testing is negative, then continue lumbar spinal stenosis tx per Dr. Shon BatonBrooks

## 2017-12-21 LAB — CERULOPLASMIN: CERULOPLASMIN: 25.2 mg/dL (ref 19.0–39.0)

## 2017-12-21 LAB — NEUROMYELITIS OPTICA AUTOAB, IGG

## 2017-12-24 ENCOUNTER — Telehealth: Payer: Self-pay | Admitting: *Deleted

## 2017-12-24 NOTE — Telephone Encounter (Signed)
Spoke with patient and informed her that her labs are okay. She does not have MRIs scheduled; advised [facility] and schedule. Advised her after MRIs are completed, she'll get a call in several days with results. She verbalized understanding, appreciation of call. This RN gave her [facility] number.

## 2018-01-02 ENCOUNTER — Other Ambulatory Visit: Payer: 59

## 2018-01-02 ENCOUNTER — Ambulatory Visit
Admission: RE | Admit: 2018-01-02 | Discharge: 2018-01-02 | Disposition: A | Discharge status: Home / Self Care | Payer: 59 | Source: Ambulatory Visit | Attending: Diagnostic Neuroimaging | Admitting: Diagnostic Neuroimaging

## 2018-01-02 DIAGNOSIS — R200 Anesthesia of skin: Secondary | ICD-10-CM | POA: Diagnosis not present

## 2018-01-02 DIAGNOSIS — R202 Paresthesia of skin: Principal | ICD-10-CM

## 2018-01-02 MED ORDER — GADOBENATE DIMEGLUMINE 529 MG/ML IV SOLN
20.0000 mL | Freq: Once | INTRAVENOUS | Status: AC | PRN
  Administered 2018-01-02: 20 mL via INTRAVENOUS

## 2018-01-07 ENCOUNTER — Telehealth: Payer: Self-pay | Admitting: Diagnostic Neuroimaging

## 2018-01-07 NOTE — Telephone Encounter (Signed)
Patient calling for MRI results.

## 2018-01-08 ENCOUNTER — Telehealth: Payer: Self-pay | Admitting: *Deleted

## 2018-01-08 DIAGNOSIS — G959 Disease of spinal cord, unspecified: Secondary | ICD-10-CM

## 2018-01-08 NOTE — Telephone Encounter (Signed)
I spoke with patient about MRI results. Will proceed with addl testing (CT chest, abd, pelvis), labs and lumbar puncture. -VRP

## 2018-01-08 NOTE — Telephone Encounter (Signed)
Spoke with patient and informed her that her MRI brain and MRI thoracic spine results are unremarkable. Advised her that the cervical spine MRI did show a lesion, and Dr Marjory LiesPenumalli will discuss this with her. She verbalized understanding and this call was transferred to Dr Marjory LiesPenumalli.

## 2018-01-14 ENCOUNTER — Telehealth: Payer: Self-pay | Admitting: Diagnostic Neuroimaging

## 2018-01-14 NOTE — Telephone Encounter (Signed)
Pt thought she was to have more testing but has not rec'd a call. Last tele note read from Dr Demetrius CharityP reads: I spoke with patient about MRI results. Will proceed with addl testing (CT chest, abd, pelvis), labs and lumbar puncture. -VRP. Please call to advise

## 2018-01-15 NOTE — Telephone Encounter (Signed)
Will start with CT chest, abd, pelvis. -VRP

## 2018-01-15 NOTE — Addendum Note (Signed)
Addended by: Joycelyn SchmidPENUMALLI, Tanishi Nault R on: 01/15/2018 04:16 PM   Modules accepted: Orders

## 2018-01-15 NOTE — Telephone Encounter (Signed)
Spoke with patient and informed her that Dr Marjory LiesPenumalli has ordered a CT of chest, abdomen, pelvis. He will wait for those results before placing any other orders. Advised she will get a call to schedule CT scans, and if no call by Thurs afternoon, advised she call back to check on it. She then asked if she can use ice or heat on her neck. This RN advised she may but avoid extreme temperatures. She may want to rotate ice and heat. She verbalized understanding, appreciation.

## 2018-01-16 ENCOUNTER — Telehealth: Payer: Self-pay | Admitting: Diagnostic Neuroimaging

## 2018-01-16 NOTE — Telephone Encounter (Signed)
[employer] Auth: NPR via [employer] website order sent to [facility]. They will reach out to the pt to schedule.  °

## 2018-01-25 ENCOUNTER — Ambulatory Visit
Admission: RE | Admit: 2018-01-25 | Discharge: 2018-01-25 | Disposition: A | Discharge status: Home / Self Care | Payer: 59 | Source: Ambulatory Visit | Attending: Diagnostic Neuroimaging | Admitting: Diagnostic Neuroimaging

## 2018-01-25 DIAGNOSIS — G959 Disease of spinal cord, unspecified: Secondary | ICD-10-CM

## 2018-01-25 IMAGING — CT CT CHEST W/ CM
1 series · 8 of 32 positions shown, 10 images · IV contrast (APPLIED)
Comparison: Chest CT 07/31/2012

CLINICAL DATA: Lesion of the spinal cord for 2 months.

EXAM:
CT CHEST WITH CONTRAST
CT ABDOMEN AND PELVIS WITH AND WITHOUT CONTRAST
TECHNIQUE: Multidetector CT imaging of the chest was performed during
intravenous contrast administration. Multidetector CT imaging of the
abdomen and pelvis was performed following the standard protocol
before and during bolus administration of intravenous contrast.
CONTRAST:  100mL [id number] IOPAMIDOL ([id number]) INJECTION 61%

[Series 3: cor · coronal · 0.68mm/px · 8 of 143 slices shown, 10 images]
[im 5/143  lung]
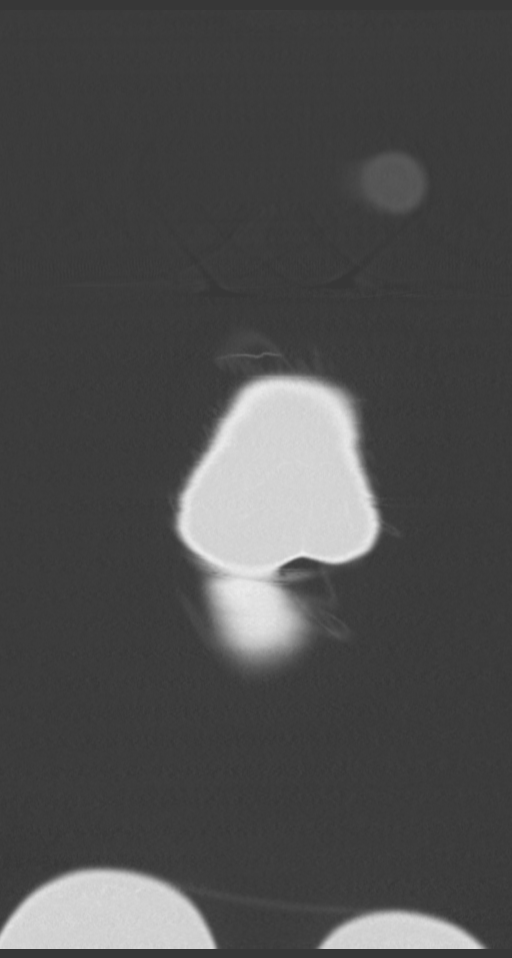
[im 10/143  lung]
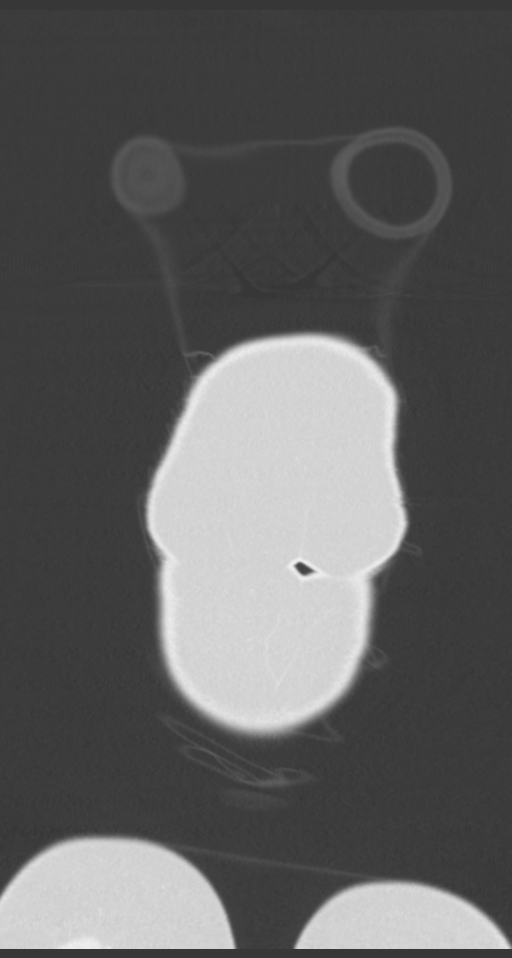
[im 14/143  lung]
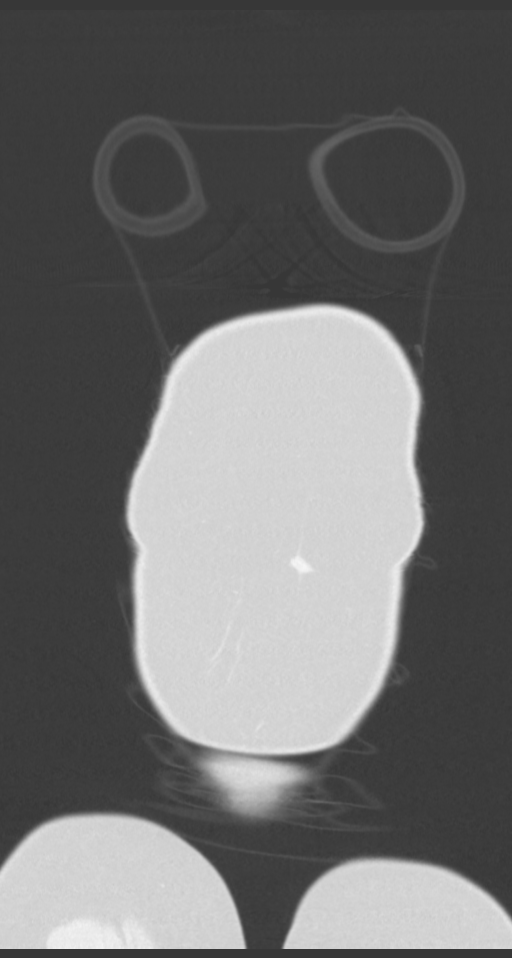
[im 19/143  soft-tissue]
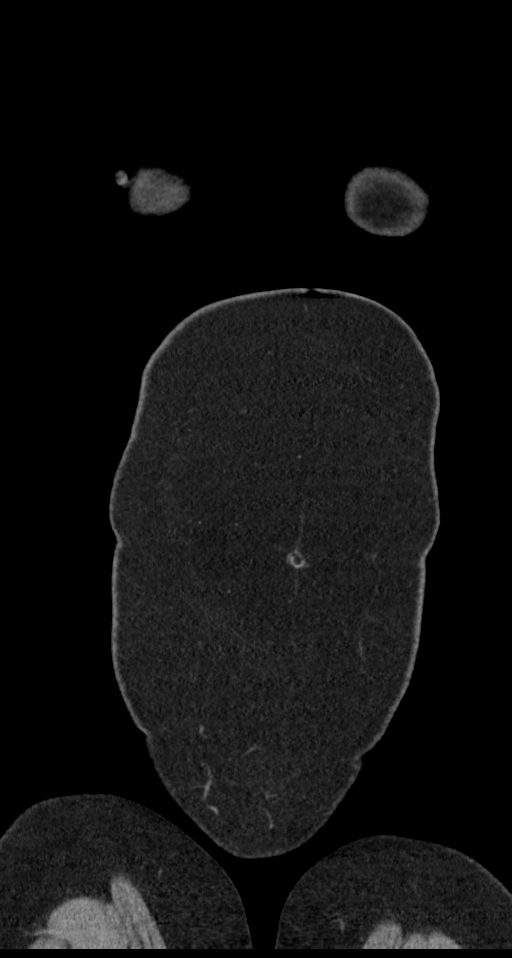
[im 19/143  lung]
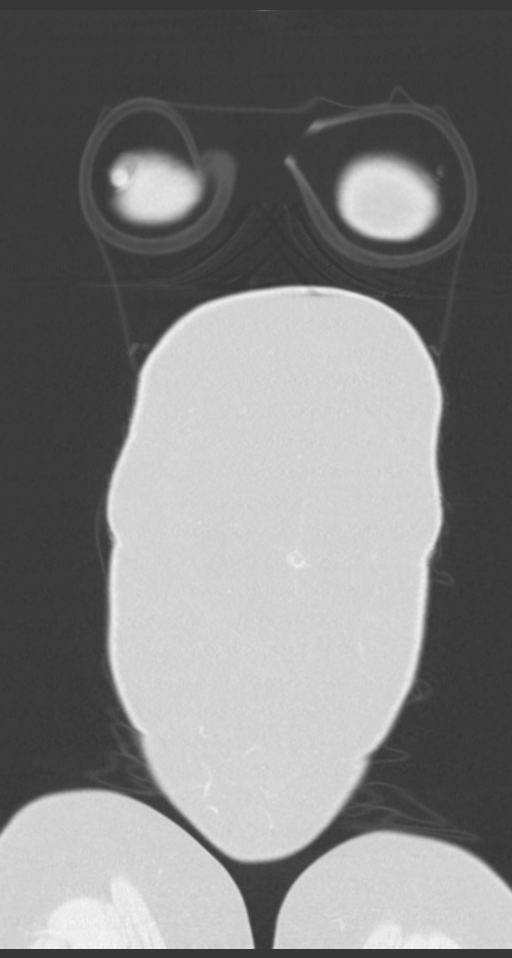
[im 19/143  bone]
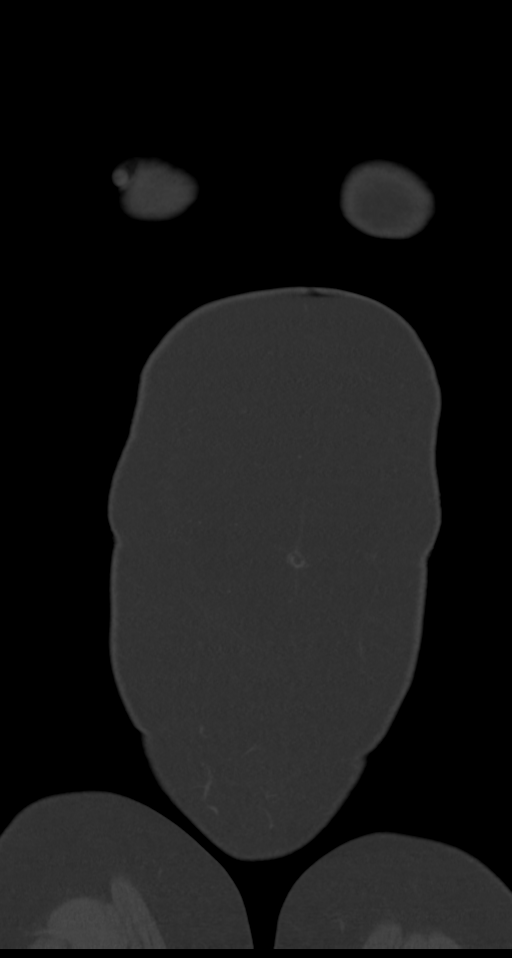
[im 46/143  soft-tissue]
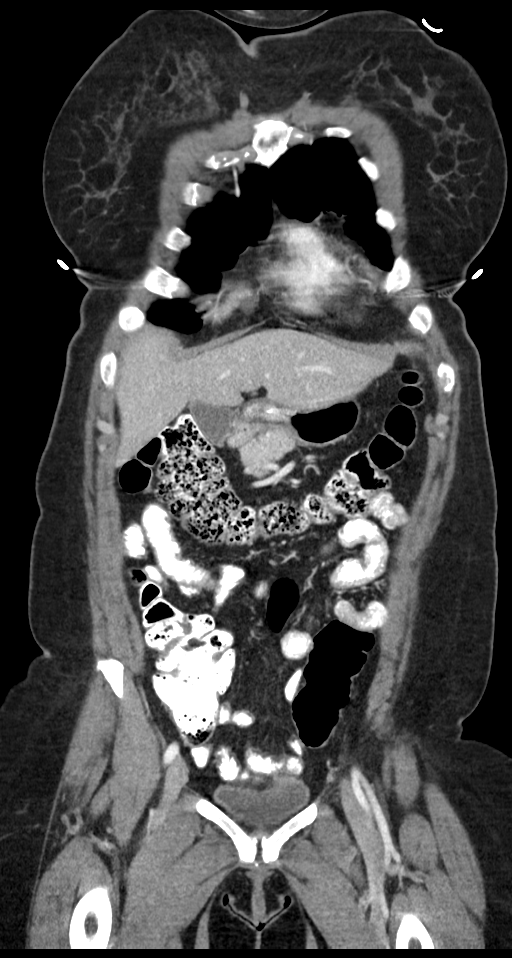
[im 74/143  soft-tissue]
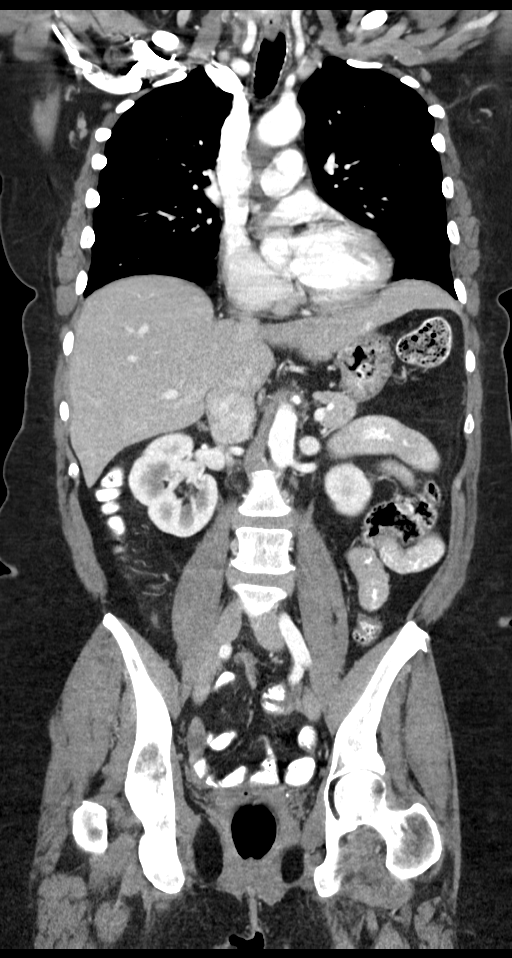
[im 101/143  soft-tissue]
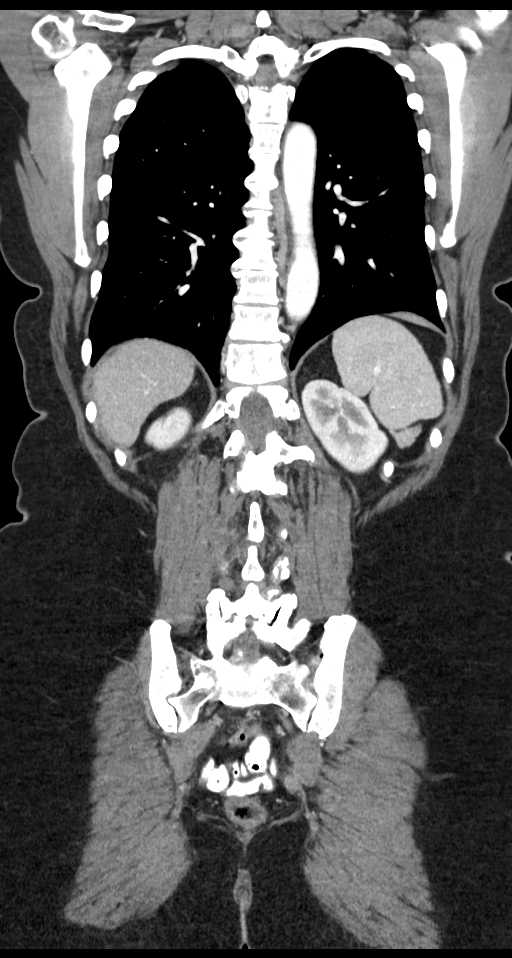
[im 129/143  soft-tissue]
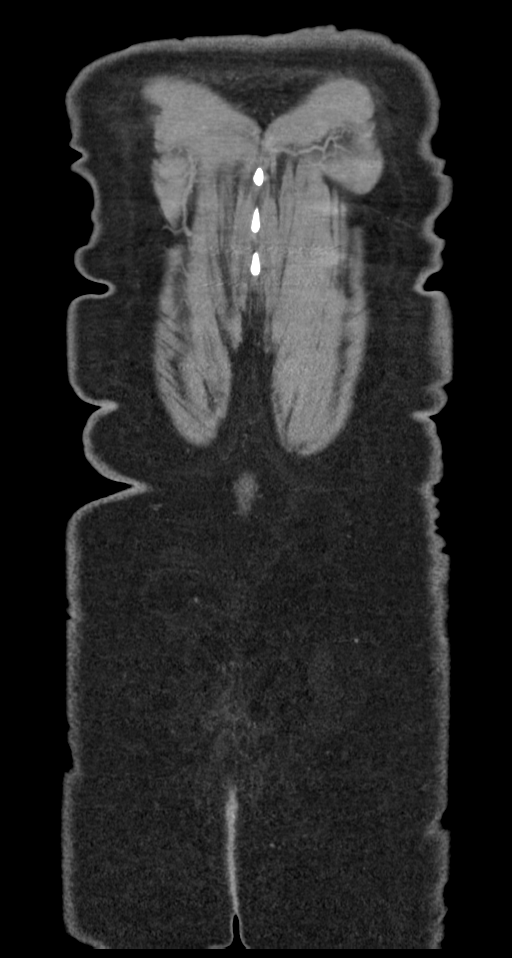

[8 of 32 positions shown; findings below may reference images not displayed]

FINDINGS: CT CHEST FINDINGS

Cardiovascular: No significant vascular findings. Normal heart size.
No pericardial effusion.

Mediastinum/Nodes: No enlarged mediastinal, hilar, or axillary lymph
nodes. Thyroid gland, trachea, and esophagus demonstrate no
significant findings. Small hiatal hernia.

Lungs/Pleura: Minimal scarring versus atelectasis in the dependent
portion of the right lower lobe. Tiny pleural calcification in the
posterior right lower lobe, image 80/132, sequence 6.

Musculoskeletal: No chest wall mass or suspicious bone lesions
identified.

CT ABDOMEN AND PELVIS FINDINGS

Hepatobiliary: No focal liver abnormality is seen. No gallstones,
gallbladder wall thickening, or biliary dilatation.

Pancreas: Unremarkable. No pancreatic ductal dilatation or
surrounding inflammatory changes.

Spleen: Normal in size without focal abnormality.

Adrenals/Urinary Tract: Adrenal glands are unremarkable. Kidneys are
normal, without renal calculi, focal lesion, or hydronephrosis.
Bladder is unremarkable.

Stomach/Bowel: Stomach is within normal limits. Appendix appears
normal. No evidence of bowel wall thickening, distention, or
inflammatory changes.

Vascular/Lymphatic: Minimal aortic atherosclerosis. No enlarged
abdominal or pelvic lymph nodes.

Reproductive: Status post hysterectomy. No adnexal masses.

Other: No abdominal wall hernia or abnormality. No abdominopelvic
ascites.

Musculoskeletal: Spondylosis of the thoraco lumbar spine. Mild
sclerotic changes of the right pedicle of T3 vertebral body, and the
left posterior facet of L1 vertebral body.
IMPRESSION: Minimal scarring versus atelectasis in the posterior right lower
lobe, with tiny pleural calcification noted, nonspecific.

No evidence of abnormalities within the solid abdominal organs.

Mild sclerotic changes of the right pedicle of T3 vertebral body,
and the left posterior facet of T1 vertebral body. In the absence of
malignancy capable of producing sclerotic lesions these likely
represent early degenerative changes.

Minimal aortic atherosclerosis.

## 2018-01-25 MED ORDER — IOPAMIDOL (ISOVUE-300) INJECTION 61%
100.0000 mL | Freq: Once | INTRAVENOUS | Status: AC | PRN
  Administered 2018-01-25: 100 mL via INTRAVENOUS

## 2018-01-28 ENCOUNTER — Telehealth: Payer: Self-pay

## 2018-01-28 ENCOUNTER — Other Ambulatory Visit: Payer: Self-pay | Admitting: Diagnostic Neuroimaging

## 2018-01-28 DIAGNOSIS — G959 Disease of spinal cord, unspecified: Secondary | ICD-10-CM

## 2018-01-28 NOTE — Progress Notes (Signed)
CT chest, abd, pelvis unremarkable. Will proceed with LP. -VRP

## 2018-01-28 NOTE — Telephone Encounter (Signed)
-----   Message from Suanne Marker, MD sent at 01/28/2018  9:27 AM EDT ----- No evidence of abnormalities within the solid abdominal organs. -VRP

## 2018-01-28 NOTE — Telephone Encounter (Signed)
Spoke with the patient and she verbalized understanding her results. No questions at this time.  

## 2018-01-30 ENCOUNTER — Telehealth: Payer: Self-pay | Admitting: Diagnostic Neuroimaging

## 2018-01-30 NOTE — Telephone Encounter (Signed)
Pt called she is wanting to know why LP has been ordered if nothing has been seen by other test. She is aware of the lesion on the spine. She is not sure what a lesion is, please explain. Please call to advise

## 2018-01-30 NOTE — Telephone Encounter (Signed)
I spoke to pt and relayed that LP was ordered per Dr. Marjory Lies as a continuation of work up for her sx and (the MRI cervical) lesion.  I instructed on the LP/ and post LP care,  and looking to rule out MS or other problem.  She verbalized understanding.  Would need to have her daughter come with her.  I recommended someone to stay with her for the 24 hours post LP.  She appreciated call.

## 2018-02-18 ENCOUNTER — Telehealth: Payer: Self-pay | Admitting: *Deleted

## 2018-02-18 ENCOUNTER — Ambulatory Visit
Admission: RE | Admit: 2018-02-18 | Discharge: 2018-02-18 | Disposition: A | Discharge status: Home / Self Care | Payer: 59 | Source: Ambulatory Visit | Attending: Diagnostic Neuroimaging | Admitting: Diagnostic Neuroimaging

## 2018-02-18 ENCOUNTER — Other Ambulatory Visit (HOSPITAL_COMMUNITY)
Admission: RE | Admit: 2018-02-18 | Discharge: 2018-02-18 | Disposition: A | Discharge status: Home / Self Care | Payer: 59 | Source: Ambulatory Visit | Attending: Diagnostic Radiology | Admitting: Diagnostic Radiology

## 2018-02-18 DIAGNOSIS — G959 Disease of spinal cord, unspecified: Secondary | ICD-10-CM | POA: Diagnosis present

## 2018-02-18 NOTE — Discharge Instructions (Signed)

## 2018-02-18 NOTE — Telephone Encounter (Signed)
Received call from [employer] with CSF results. She stated none of the results are critical. This RN requested all lab results be faxed to Dr Marjory Lies. Confirmed correct fax number with Cara. She stated she would fax over today.

## 2018-02-18 NOTE — Progress Notes (Signed)
Blood obtained from [facility] for LP labs. Pt tolerated procedure well. Site is unremarkable.

## 2018-02-21 NOTE — Telephone Encounter (Addendum)
Discussed lab results with Dr Marjory Lies. Per Dr Marjory Lies, spoke with patient and informed her that the CSF showed the protein slightly high; advised this could represent inflammation. Advised her he will wait for OCB results before determining the next steps. She asked if that would maybe tell if she had MS; this RN advised it would help Dr Marjory Lies to further diagnose her.  She asked if result would be back by tomorrow. This RN advised it may or may not. Informed her Dr Marjory Lies is out of the office until 10/9 but if the result comes in, a work in dr cal read so that she can be called. Also advised she call Mon if she would like to check back. She verbalized understanding appreciation.

## 2018-02-21 NOTE — Telephone Encounter (Addendum)
Pt called checking on status of test results. Please call to advise

## 2018-02-24 LAB — CSF CELL COUNT WITH DIFFERENTIAL
Basophils, %: 0 %
Lymphs, CSF: 76 % (ref 40–80)
MONOCYTE/MACROPHAGE: 20 % (ref 15–45)
RBC COUNT CSF: 47 {cells}/uL — AB (ref 0–10)
Segmented Neutrophils-CSF: 4 % (ref 0–6)
WBC CSF: 6 {cells}/uL — AB (ref 0–5)

## 2018-02-24 LAB — PROTEIN, CSF: TOTAL PROTEIN, CSF: 69 mg/dL — AB (ref 15–45)

## 2018-02-24 LAB — CSF CULTURE
GRAM STAIN: NONE SEEN
MICRO NUMBER: 91170883
RESULT: NO GROWTH
SPECIMEN QUALITY: ADEQUATE

## 2018-02-24 LAB — OLIGOCLONAL BANDS, CSF + SERM

## 2018-02-24 LAB — GLUCOSE, CSF: Glucose, CSF: 51 mg/dL (ref 40–80)

## 2018-02-25 ENCOUNTER — Telehealth: Payer: Self-pay | Admitting: Diagnostic Neuroimaging

## 2018-02-25 NOTE — Telephone Encounter (Signed)
I tried calling Dr. Shon Baton at 4:55 PM, but he had left the office for today. Will try again tomorrow. OCB are positive in the CSF.

## 2018-02-25 NOTE — Telephone Encounter (Signed)
I called Robyn back. Dr. Shon Baton has a question about the MRI results and is asking that Dr. Frances Furbish give Dr. Shon Baton a call before 5pm today.

## 2018-02-25 NOTE — Telephone Encounter (Signed)
WUJWJ@ Dr Venita Lick office has called initially asking for Dr Marjory Lies.  Cynthia Villegas was made aware he will not return until Wed. She then asked for work in doctor for Dr Venita Lick to speak with concerning pt.  She was informed that Dr Frances Furbish will need to call her back.  Cynthia Villegas stated she can be reached at [phone] xt [phone]

## 2018-02-27 NOTE — Telephone Encounter (Signed)
Dr. Venita Lick from Ortho called on Monday asking to speak with one of our physicians regarding questions about pt's MRI. Dr. Frances Furbish attempted to call Dr. Shon Baton back on Monday but was told that Dr.Brooks was gone for the day. Please call Robyn or Dr. Shon Baton at [phone] ext [phone].

## 2018-03-04 NOTE — Telephone Encounter (Signed)
Dr Shon Baton @ [facility] has called re: the MRI for pt.  He is asking for a call back from Dr Marjory Lies.  Dr Shon Baton has provided his mobile # and is asking for a call anytime after 4:30 @ [phone]

## 2018-03-04 NOTE — Telephone Encounter (Signed)
I called Dr. Shon Baton. No answer. Will try again later. -VRP

## 2018-03-05 NOTE — Telephone Encounter (Signed)
Per Dr Marjory Lies, spoke with patient and informed her that the final results of her CSF came back abnormal. Advised her Dr Marjory Lies would like to see her in follow up to discuss results of all her tests. She asked if he was looking for something specific. This RN advised her that the abnormality could be inflammation, infection or something else, and Dr Marjory Lies will discuss with her and plan next steps. Scheduled her for tomorrow, advised she arrive 12:40 pm to check in. She verbalized understanding, appreciation.

## 2018-03-06 ENCOUNTER — Encounter: Payer: Self-pay | Admitting: Diagnostic Neuroimaging

## 2018-03-06 ENCOUNTER — Ambulatory Visit (INDEPENDENT_AMBULATORY_CARE_PROVIDER_SITE_OTHER): Payer: 59 | Admitting: Diagnostic Neuroimaging

## 2018-03-06 VITALS — BP 160/98 | HR 73 | Ht 66.0 in | Wt 201.6 lb

## 2018-03-06 DIAGNOSIS — G373 Acute transverse myelitis in demyelinating disease of central nervous system: Secondary | ICD-10-CM

## 2018-03-06 DIAGNOSIS — G959 Disease of spinal cord, unspecified: Secondary | ICD-10-CM

## 2018-03-06 DIAGNOSIS — R202 Paresthesia of skin: Secondary | ICD-10-CM

## 2018-03-06 DIAGNOSIS — R200 Anesthesia of skin: Secondary | ICD-10-CM

## 2018-03-06 MED ORDER — PREDNISONE 10 MG PO TABS: ORAL_TABLET | ORAL | 0 refills | Status: DC

## 2018-03-06 NOTE — Patient Instructions (Signed)
-   repeat MRI cervical spine (with and without)  - trial of prednisone tapering (6 day) course

## 2018-03-06 NOTE — Progress Notes (Signed)
[facility]  PATIENT: Cynthia Villegas DOB: 12/01/1960  REFERRING CLINICIAN: D Brooks HISTORY FROM: patient  REASON FOR VISIT: new consult    HISTORICAL  CHIEF COMPLAINT:  Chief Complaint  Patient presents with  . Numbness    rm 7, discuss tests results    HISTORY OF PRESENT ILLNESS:   UPDATE (03/05/18, VRP): Since last visit, patient is here to review test results.  CSF analysis shows greater than 5 oligoclonal bands.  Symptoms and lower extremity's are improving.  Symptoms in the left hand are persistent.  Patient also having some neck stiffness.  She has tried physical therapy which has helped.  Groin and genital numbness has resolved.  No issues with incontinence.  PRIOR HPI (12/19/17): 57 year old female here for evaluation of numbness.   11/28/2017 patient noticed rectal numbness when using the bathroom.  Patient then went to [facility] ER and had MRI lumbar spine --> showed some degenerative changes and narrowing. Then had ER visit on 12/09/17, had MRI of the thoracic and lumbar spine --> moderate to severe spinal stenosis at L3-4 and moderate spinal stenosis at L4-5.  No thoracic spinal cord compression.  Patient followed up with orthopedic surgery clinic.  Patient was referred here for second opinion.  No bowel or bladder incontinence.  Groin and genital numbness has improved.  She still has low back pain and leg pain which is exacerbated by standing and walking.  Numbness in her feet and legs are stable.  Also has noted some numbness in her left hand.   REVIEW OF SYSTEMS: Full 14 system review of systems performed and negative with exception of: Back pain neck pain numbness next stiffness.   ALLERGIES: No Known Allergies  HOME MEDICATIONS: Outpatient Medications Prior to Visit  Medication Sig Dispense Refill  . acetaminophen (TYLENOL) 650 MG CR tablet Take 650 mg by mouth every 8 (eight) hours as needed for pain. As needed for discomfort    . aspirin  EC 81 MG tablet Take 81 mg by mouth daily.    Marland Kitchen levothyroxine (SYNTHROID, LEVOTHROID) 100 MCG tablet Take 100 mcg by mouth daily before breakfast.    . gabapentin (NEURONTIN) 300 MG capsule 03/06/18 has never taken this  0   No facility-administered medications prior to visit.     PAST MEDICAL HISTORY: Past Medical History:  Diagnosis Date  . Back pain   . Graves disease   . History of blood clots 2014    PAST SURGICAL HISTORY: Past Surgical History:  Procedure Laterality Date  . OTHER SURGICAL HISTORY     Hysterectomy    FAMILY HISTORY: Family History  Problem Relation Age of Onset  . Breast cancer Mother   . Dementia Mother   . Heart disease Father     SOCIAL HISTORY: Social History   Socioeconomic History  . Marital status: Single    Spouse name: Not on file  . Number of children: 1  . Years of education: 34  . Highest education level: Not on file  Occupational History  . Not on file  Social Needs  . Financial resource strain: Not on file  . Food insecurity:    Worry: Not on file    Inability: Not on file  . Transportation needs:    Medical: Not on file    Non-medical: Not on file  Tobacco Use  . Smoking status: Never Smoker  . Smokeless tobacco: Never Used  Substance and Sexual Activity  . Alcohol use: Yes  Comment: occasional  . Drug use: No  . Sexual activity: Not on file  Lifestyle  . Physical activity:    Days per week: Not on file    Minutes per session: Not on file  . Stress: Not on file  Relationships  . Social connections:    Talks on phone: Not on file    Gets together: Not on file    Attends religious service: Not on file    Active member of club or organization: Not on file    Attends meetings of clubs or organizations: Not on file    Relationship status: Not on file  . Intimate partner violence:    Fear of current or ex partner: Not on file    Emotionally abused: Not on file    Physically abused: Not on file    Forced sexual  activity: Not on file  Other Topics Concern  . Not on file  Social History Narrative   Lives home alone.  Has one child.  Working from home now, but normally works in office setting.  Education HS grad.      PHYSICAL EXAM  GENERAL EXAM/CONSTITUTIONAL: Vitals:  Vitals:   03/06/18 1247  BP: (!) 160/98  Pulse: 73  Weight: 201 lb 9.6 oz (91.4 kg)  Height: 5\' 6"  (1.676 m)   Body mass index is 32.54 kg/m. Wt Readings from Last 3 Encounters:  03/06/18 201 lb 9.6 oz (91.4 kg)  12/19/17 200 lb (90.7 kg)  12/09/17 202 lb (91.6 kg)    Patient is in no distress; well developed, nourished and groomed; neck is supple  CARDIOVASCULAR:  Examination of carotid arteries is normal; no carotid bruits  Regular rate and rhythm, no murmurs  Examination of peripheral vascular system by observation and palpation is normal  EYES:  Ophthalmoscopic exam of optic discs and posterior segments is normal; no papilledema or hemorrhages No exam data present  MUSCULOSKELETAL:  Gait, strength, tone, movements noted in Neurologic exam below  NEUROLOGIC: MENTAL STATUS:  No flowsheet data found.  awake, alert, oriented to person, place and time  recent and remote memory intact  normal attention and concentration  language fluent, comprehension intact, naming intact  fund of knowledge appropriate  CRANIAL NERVE:   2nd - no papilledema on fundoscopic exam  2nd, 3rd, 4th, 6th - pupils equal and reactive to light, visual fields full to confrontation, extraocular muscles intact, no nystagmus  5th - facial sensation symmetric  7th - facial strength symmetric  8th - hearing intact  9th - palate elevates symmetrically, uvula midline  11th - shoulder shrug symmetric  12th - tongue protrusion midline  MOTOR:   normal bulk and tone, full strength in the BUE, BLE  SENSORY:   normal and symmetric to light touch, temperature, vibration  COORDINATION:   finger-nose-finger, fine  finger movements normal  REFLEXES:   deep tendon reflexes present and symmetric  GAIT/STATION:   narrow based gait     DIAGNOSTIC DATA (LABS, IMAGING, TESTING) - I reviewed patient records, labs, notes, testing and imaging myself where available.  Lab Results  Component Value Date   WBC 8.7 12/09/2017   HGB 13.4 12/09/2017   HCT 39.8 12/09/2017   MCV 88.6 12/09/2017   PLT 222 12/09/2017      Component Value Date/Time   NA 142 12/09/2017 0104   K 3.1 (L) 12/09/2017 0104   CL 103 12/09/2017 0104   CO2 31 12/09/2017 0104   GLUCOSE 128 (H) 12/09/2017 0104  BUN 9 12/09/2017 0104   CREATININE 0.78 12/09/2017 0104   CALCIUM 9.0 12/09/2017 0104   PROT 7.0 08/01/2012 0620   ALBUMIN 3.2 (L) 08/01/2012 0620   AST 32 08/01/2012 0620   ALT 38 (H) 08/01/2012 0620   ALKPHOS 80 08/01/2012 0620   BILITOT 0.6 08/01/2012 0620   GFRNONAA >60 12/09/2017 0104   GFRAA >60 12/09/2017 0104   No results found for: CHOL, HDL, LDLCALC, LDLDIRECT, TRIG, CHOLHDL No results found for: ZOXW9U Lab Results  Component Value Date   VITAMINB12 447 12/09/2017   Lab Results  Component Value Date   TSH 2.364 12/09/2017    12/09/17 MR THORACIC SPINE [I reviewed images myself and agree with interpretation. -VRP]  - MRI thoracic spine demonstrates multiple small disc protrusions, but no significant spinal stenosis, or cord compression. LEFT T10-11 foraminal narrowing, uncertain significance. - No thoracic spine fracture, intrinsic cord abnormality, visible hydromyelia, or intraspinal mass lesion.  12/09/17 MR LUMBAR SPINE [I reviewed images myself and agree with interpretation. L3-4 moderate-severe spinal stenosis; L4-5 moderate spinal stenosis. -VRP]  - Transitional anatomy.  L5-S1 disc is hypoplastic. 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 Disc pathology, short pedicles, and posterior element hypertrophy contribute to possible  subarticular zone narrowing at multiple levels. See discussion above. - There are no areas of critical spinal stenosis to suggest cauda equina syndrome, although the degree of stenosis at L3-4 is moderate to severe.  12/09/17 labs -Vitamin B12, RPR, copper, HIV, ceruloplasmin, and MO antibody --> negative  01/02/18 MRI brain [I reviewed images myself and agree with interpretation. -VRP]  1.   There are a couple punctate T2/FLAIR hypertense foci in the subcortical white matter.  This is a nonspecific finding, common for age and likely representing minimal chronic microvascular ischemic changes. 2.   There is a normal enhancement pattern and there are no acute findings.  01/02/18 MRI cervical spine [I reviewed images myself and agree with interpretation. -VRP]  1.    There is an enhancing focus in the posterior spinal cord adjacent to C5 that slightly expands the spinal cord.  It measures 12 mm longitudinally on the postcontrast images.  Although nonspecific, this most likely represents an inflammatory or autoimmune transverse myelitis.  Neoplasm cannot be ruled out and a follow-up evaluation in a couple months is recommended. 2.    There are degenerative changes at C4-C5, C5-C6. C6-C7 and T2-T3 that do not lead to any nerve root compression.  01/02/18 MRI thoracic spine [I reviewed images myself and agree with interpretation. -VRP]  1.    The spinal cord appears normal. 2.    Multilevel degenerative changes as detailed above.  There is foraminal narrowing to the left at T10-11 encroaching upon the left T10 nerve root without causing definite nerve root compression. 3.    There is a normal enhancement pattern.  02/18/18 CSF - The patient's CSF contains >5 well defined gamma  restriction bands that are not present in the patient's  corresponding serum sample. - WBC 6, RBC 47, glucose 51, protein 69 - cytology: negative for malignancy   ASSESSMENT AND PLAN  57 y.o. year old female here with  new onset numbness from mid torso down to bilateral lower extremities on November 28, 2016 associated with genital and groin numbness, now gradually improving.  Neurologic examination notable for decreased sensation in the lower extremities, brisk reflexes at the knees, and unsteady gait.  MRI of the thoracic spine was  reviewed and no evidence of intrinsic spinal cord lesions.  MRI of the lumbar spine shows significant spinal stenosis which could account for patient's gait difficulty.  However patient does report numbness in left hand and resolving perineal numbness which are otherwise not well explained.  Therefore we did further work-up with additional CNS imaging studies --> enhancing cervical spinal cord lesion found, with + OCB on CSF testing. 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 Transverse myelitis (HCC)   2. Spinal cord lesion (HCC)   3. Numbness and tingling      PLAN:  - repeat MRI cervical spine (with and without); rule out neoplasm - trial of prednisone tapering (6 day) course - repeat MRI brain in 9-12 months  Meds ordered this encounter  Medications  . predniSONE (DELTASONE) 10 MG tablet    Sig: Take 60mg  on day 1. Reduce by 10mg  each subsequent day. (60, 50, 40, 30, 20, 10, stop)    Dispense:  21 tablet    Refill:  0   Orders Placed This Encounter  Procedures  . MR CERVICAL SPINE W WO CONTRAST  . VITAMIN D 25 Hydroxy (Vit-D Deficiency, Fractures)   Return in about 3 months (around 06/06/2018).    Suanne Marker, MD 03/06/2018, 1:19 PM Certified in Neurology, Neurophysiology and Neuroimaging  [facility] [address], [address] [place], [place] [address] [phone]

## 2018-03-07 LAB — VITAMIN D 25 HYDROXY (VIT D DEFICIENCY, FRACTURES): Vit D, 25-Hydroxy: 22.1 ng/mL — ABNORMAL LOW (ref 30.0–100.0)

## 2018-03-07 NOTE — Telephone Encounter (Signed)
Received fax for Dr. Marjory Lies to sign order for cytology non pap from [facility] ([facility]) [phone], fax [phone]. FAX confirmation received [facility] Coding (signed order).

## 2018-03-11 ENCOUNTER — Telehealth: Payer: Self-pay | Admitting: Diagnostic Neuroimaging

## 2018-03-11 NOTE — Telephone Encounter (Signed)
[employer] auth: [employer] via [employer] website order sent to GI. They will reach out to the pt to schedule.  °

## 2018-03-11 NOTE — Telephone Encounter (Signed)
Spoke to patient she is aware of this also gave her GI phone number of [phone] and to call if she has not heard in the next 2-3 business days.

## 2018-03-27 ENCOUNTER — Ambulatory Visit
Admission: RE | Admit: 2018-03-27 | Discharge: 2018-03-27 | Disposition: A | Discharge status: Home / Self Care | Payer: 59 | Source: Ambulatory Visit | Attending: Diagnostic Neuroimaging | Admitting: Diagnostic Neuroimaging

## 2018-03-27 DIAGNOSIS — G373 Acute transverse myelitis in demyelinating disease of central nervous system: Secondary | ICD-10-CM

## 2018-03-27 MED ORDER — GADOBENATE DIMEGLUMINE 529 MG/ML IV SOLN
18.0000 mL | Freq: Once | INTRAVENOUS | Status: AC | PRN
  Administered 2018-03-27: 18 mL via INTRAVENOUS

## 2018-03-29 ENCOUNTER — Telehealth: Payer: Self-pay | Admitting: *Deleted

## 2018-03-29 NOTE — Telephone Encounter (Signed)
-----   Message from Suanne Marker, MD sent at 03/28/2018  5:19 PM EST ----- Spinal cord looks better than last imaging. Continue to monitor symptoms. Please call patient. Continue current plan. -VRP

## 2018-03-29 NOTE — Telephone Encounter (Signed)
Spoke to pt and relayed that the imaging study of MRI cervical looks better then last.  Continue to monitor sx, and continue current plan. She verbalized understanding.

## 2018-05-28 ENCOUNTER — Encounter: Payer: Self-pay | Admitting: Diagnostic Neuroimaging

## 2018-05-28 ENCOUNTER — Ambulatory Visit: Payer: BLUE CROSS/BLUE SHIELD | Admitting: Diagnostic Neuroimaging

## 2018-05-28 VITALS — BP 138/87 | HR 65 | Ht 66.0 in | Wt 210.4 lb

## 2018-05-28 DIAGNOSIS — R200 Anesthesia of skin: Secondary | ICD-10-CM

## 2018-05-28 DIAGNOSIS — G373 Acute transverse myelitis in demyelinating disease of central nervous system: Secondary | ICD-10-CM | POA: Diagnosis not present

## 2018-05-28 DIAGNOSIS — M48062 Spinal stenosis, lumbar region with neurogenic claudication: Secondary | ICD-10-CM

## 2018-05-28 DIAGNOSIS — R202 Paresthesia of skin: Secondary | ICD-10-CM

## 2018-05-28 NOTE — Progress Notes (Signed)
[facility]  PATIENT: Cynthia JensenJanice R Villegas DOB: [birth year]  REFERRING CLINICIAN: D Brooks HISTORY FROM: patient  REASON FOR VISIT: follow up   HISTORICAL  CHIEF COMPLAINT:  Chief Complaint  Patient presents with  . Follow-up    Rm 7, alone  . Transverse Myelitis    stable, more back pain.    HISTORY OF PRESENT ILLNESS:   UPDATE (05/28/18, VRP): Since last visit, left arm numbness is stable. Low back pain slightly worse, but patient feels this is related to inactivity and slight weight gain. Severity is mild. No alleviating or aggravating factors.    UPDATE (03/05/18, VRP): Since last visit, patient is here to review test results.  CSF analysis shows greater than 5 oligoclonal bands.  Symptoms and lower extremity's are improving.  Symptoms in the left hand are persistent.  Patient also having some neck stiffness.  She has tried physical therapy which has helped.  Groin and genital numbness has resolved.  No issues with incontinence.  PRIOR HPI (12/19/17): 58 year old female here for evaluation of numbness.   11/28/2017 patient noticed rectal numbness when using the bathroom.  [facility] then [facility] and had MRI lumbar spine --> showed some degenerative changes and narrowing. Then had ER visit on 12/09/17, had MRI of the thoracic and lumbar spine --> moderate to severe spinal stenosis at L3-4 and moderate spinal stenosis at L4-5.  No thoracic spinal cord compression.  Patient followed up with orthopedic surgery clinic.  Patient was referred here for second opinion.  No bowel or bladder incontinence.  Groin and genital numbness has improved.  She still has low back pain and leg pain which is exacerbated by standing and walking.  Numbness in her feet and legs are stable.  Also has noted some numbness in her left hand.   REVIEW OF SYSTEMS: Full 14 system review of systems performed and negative with exception of: back pain.   ALLERGIES: No Known Allergies  HOME  MEDICATIONS: Outpatient Medications Prior to Visit  Medication Sig Dispense Refill  . acetaminophen (TYLENOL) 650 MG CR tablet Take 650 mg by mouth every 8 (eight) hours as needed for pain. As needed for discomfort    . aspirin EC 81 MG tablet Take 81 mg by mouth daily.    Marland Kitchen. gabapentin (NEURONTIN) 300 MG capsule 03/06/18 has never taken this  0  . levothyroxine (SYNTHROID, LEVOTHROID) 100 MCG tablet Take 100 mcg by mouth daily before breakfast.    . predniSONE (DELTASONE) 10 MG tablet Take 60mg  on day 1. Reduce by 10mg  each subsequent day. (60, 50, 40, 30, 20, 10, stop) 21 tablet 0   No facility-administered medications prior to visit.     PAST MEDICAL HISTORY: Past Medical History:  Diagnosis Date  . Back pain   . Graves disease   . History of blood clots 2014    PAST SURGICAL HISTORY: Past Surgical History:  Procedure Laterality Date  . OTHER SURGICAL HISTORY     Hysterectomy    FAMILY HISTORY: Family History  Problem Relation Age of Onset  . Breast cancer Mother   . Dementia Mother   . Heart disease Father     SOCIAL HISTORY: Social History   Socioeconomic History  . Marital status: Single    Spouse name: Not on file  . Number of children: 1  . Years of education: 6512  . Highest education level: Not on file  Occupational History  . Not on file  Social Needs  .  Financial resource strain: Not on file  . Food insecurity:    Worry: Not on file    Inability: Not on file  . Transportation needs:    Medical: Not on file    Non-medical: Not on file  Tobacco Use  . Smoking status: Never Smoker  . Smokeless tobacco: Never Used  Substance and Sexual Activity  . Alcohol use: Yes    Comment: occasional  . Drug use: No  . Sexual activity: Not on file  Lifestyle  . Physical activity:    Days per week: Not on file    Minutes per session: Not on file  . Stress: Not on file  Relationships  . Social connections:    Talks on phone: Not on file    Gets together: Not  on file    Attends religious service: Not on file    Active member of club or organization: Not on file    Attends meetings of clubs or organizations: Not on file    Relationship status: Not on file  . Intimate partner violence:    Fear of current or ex partner: Not on file    Emotionally abused: Not on file    Physically abused: Not on file    Forced sexual activity: Not on file  Other Topics Concern  . Not on file  Social History Narrative   Lives home alone.  Has one child.  Working from home now, but normally works in office setting.  Education HS grad.      PHYSICAL EXAM  GENERAL EXAM/CONSTITUTIONAL: Vitals:  There were no vitals filed for this visit. There is no height or weight on file to calculate BMI. Wt Readings from Last 7 Encounters:  05/28/18 210 lb 6.4 oz (95.4 kg)  03/06/18 201 lb 9.6 oz (91.4 kg)  12/19/17 200 lb (90.7 kg)  12/09/17 202 lb (91.6 kg)  09/10/12 202 lb (91.6 kg)  08/01/12 200 lb 13.4 oz (91.1 kg)    Patient is in no distress; well developed, nourished and groomed; neck is supple  CARDIOVASCULAR:  Examination of carotid arteries is normal; no carotid bruits  Regular rate and rhythm, no murmurs  Examination of peripheral vascular system by observation and palpation is normal  EYES:  Ophthalmoscopic exam of optic discs and posterior segments is normal; no papilledema or hemorrhages No exam data present  MUSCULOSKELETAL:  Gait, strength, tone, movements noted in Neurologic exam below  NEUROLOGIC: MENTAL STATUS:  No flowsheet data found.  awake, alert, oriented to person, place and time  recent and remote memory intact  normal attention and concentration  language fluent, comprehension intact, naming intact  fund of knowledge appropriate  CRANIAL NERVE:   2nd - no papilledema on fundoscopic exam  2nd, 3rd, 4th, 6th - pupils equal and reactive to light, visual fields full to confrontation, extraocular muscles intact, no  nystagmus  5th - facial sensation symmetric  7th - facial strength symmetric  8th - hearing intact  9th - palate elevates symmetrically, uvula midline  11th - shoulder shrug symmetric  12th - tongue protrusion midline  MOTOR:   normal bulk and tone, full strength in the BUE, BLE  SENSORY:   normal and symmetric to light touch, temperature, vibration  COORDINATION:   finger-nose-finger, fine finger movements normal  REFLEXES:   deep tendon reflexes present and symmetric  GAIT/STATION:   narrow based gait     DIAGNOSTIC DATA (LABS, IMAGING, TESTING) - I reviewed patient records, labs, notes, testing and  imaging myself where available.  Lab Results  Component Value Date   WBC 8.7 12/09/2017   HGB 13.4 12/09/2017   HCT 39.8 12/09/2017   MCV 88.6 12/09/2017   PLT 222 12/09/2017      Component Value Date/Time   NA 142 12/09/2017 0104   K 3.1 (L) 12/09/2017 0104   CL 103 12/09/2017 0104   CO2 31 12/09/2017 0104   GLUCOSE 128 (H) 12/09/2017 0104   BUN 9 12/09/2017 0104   CREATININE 0.78 12/09/2017 0104   CALCIUM 9.0 12/09/2017 0104   PROT 7.0 08/01/2012 0620   ALBUMIN 3.2 (L) 08/01/2012 0620   AST 32 08/01/2012 0620   ALT 38 (H) 08/01/2012 0620   ALKPHOS 80 08/01/2012 0620   BILITOT 0.6 08/01/2012 0620   GFRNONAA >60 12/09/2017 0104   GFRAA >60 12/09/2017 0104   No results found for: CHOL, HDL, LDLCALC, LDLDIRECT, TRIG, CHOLHDL No results found for: [id number] Lab Results  Component Value Date   VITAMINB12 447 12/09/2017   Lab Results  Component Value Date   TSH 2.364 12/09/2017    12/09/17 MR THORACIC SPINE [I reviewed images myself and agree with interpretation. -VRP]  - MRI thoracic spine demonstrates multiple small disc protrusions, but no significant spinal stenosis, or cord compression. LEFT T10-11 foraminal narrowing, uncertain significance. - No thoracic spine fracture, intrinsic cord abnormality, visible hydromyelia, or intraspinal mass  lesion.  12/09/17 MR LUMBAR SPINE [I reviewed images myself and agree with interpretation. L3-4 moderate-severe spinal stenosis; L4-5 moderate spinal stenosis. -VRP]  - Transitional anatomy.  L5-S1 disc is hypoplastic. 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 Disc pathology, short pedicles, and posterior element hypertrophy contribute to possible subarticular zone narrowing at multiple levels. See discussion above. - There are no areas of critical spinal stenosis to suggest cauda equina syndrome, although the degree of stenosis at L3-4 is moderate to severe.  12/09/17 labs -Vitamin B12, RPR, copper, HIV, ceruloplasmin, and NMO antibody --> negative  01/02/18 MRI brain [I reviewed images myself and agree with interpretation. -VRP]  1.   There are a couple punctate T2/FLAIR hypertense foci in the subcortical white matter.  This is a nonspecific finding, common for age and likely representing minimal chronic microvascular ischemic changes. 2.   There is a normal enhancement pattern and there are no acute findings.  01/02/18 MRI cervical spine [I reviewed images myself and agree with interpretation. -VRP]  1.    There is an enhancing focus in the posterior spinal cord adjacent to C5 that slightly expands the spinal cord.  It measures 12 mm longitudinally on the postcontrast images.  Although nonspecific, this most likely represents an inflammatory or autoimmune transverse myelitis.  Neoplasm cannot be ruled out and a follow-up evaluation in a couple months is recommended. 2.    There are degenerative changes at C4-C5, C5-C6. C6-C7 and T2-T3 that do not lead to any nerve root compression.  01/02/18 MRI thoracic spine [I reviewed images myself and agree with interpretation. -VRP]  1.    The spinal cord appears normal. 2.    Multilevel degenerative changes as detailed above.  There is foraminal narrowing to the left at T10-11 encroaching upon the left  T10 nerve root without causing definite nerve root compression. 3.    There is a normal enhancement pattern.  03/27/18 MRI cervical [I reviewed images myself and agree with interpretation. -VRP]  - Improving spinal cord lesion at C4-5; slightly reduced size and  significantly reduced enhancement. Stable lesion at C5-6. Findings are consistent with autoimmune or inflammatory process such as idiopathic transverse myelitis. - At C4-5: disc bulging and facet hypertrophy with moderate right foraminal stenosis; posterior central intrinsic T2 hyperintense spinal cord lesion noted. - At C5-6: disc bulging and facet hypertrophy with mild biforaminal stenosis; subtle right lateral spinal cord T2 hyperintensity also noted, stable from prior study.  02/18/18 CSF - The patient's CSF contains >5 well defined gamma  restriction bands that are not present in the patient's  corresponding serum sample. - WBC 6, RBC 47, glucose 51, protein 69 - cytology: negative for malignancy    ASSESSMENT AND PLAN  58 y.o. year old female here with new onset numbness from mid torso down to bilateral lower extremities on November 28, 2016 associated with genital and groin numbness, now gradually improving.  Neurologic examination notable for decreased sensation in the lower extremities, brisk reflexes at the knees, and unsteady gait.  MRI of the thoracic spine was reviewed and no evidence of intrinsic spinal cord lesions.  MRI of the lumbar spine shows significant spinal stenosis which could account for patient's gait difficulty.  However patient does report numbness in left hand and resolving perineal numbness which are otherwise not well explained.  Therefore we did further work-up with additional CNS imaging studies --> enhancing cervical spinal cord lesion found, with + OCB on CSF testing. 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  Transverse myelitis (HCC)   2. Numbness and tingling   3. Spinal stenosis of lumbar region with neurogenic claudication      PLAN:  TRANSVERSE MYELITIS (idiopathic) - repeat MRI brain in summer 2020 (surveillance for multiple sclerosis)  LUMBAR SPINAL STENOSIS - conservative mgmt for now; exercise, nutrition reviewed; follow up with orthopedic clinic (Dr. Shon Baton)  Return in about 6 months (around 11/26/2018).    Suanne Marker, MD 05/28/2018, 10:41 AM Certified in Neurology, Neurophysiology and Neuroimaging  [facility] [address], [address] [place], [place] [address] [phone]

## 2018-07-09 DIAGNOSIS — M545 Low back pain: Secondary | ICD-10-CM | POA: Diagnosis not present

## 2018-07-23 DIAGNOSIS — H5213 Myopia, bilateral: Secondary | ICD-10-CM | POA: Diagnosis not present

## 2018-07-23 DIAGNOSIS — H04123 Dry eye syndrome of bilateral lacrimal glands: Secondary | ICD-10-CM | POA: Diagnosis not present

## 2018-08-15 DIAGNOSIS — M5416 Radiculopathy, lumbar region: Secondary | ICD-10-CM | POA: Diagnosis not present

## 2018-10-18 ENCOUNTER — Telehealth: Payer: Self-pay | Admitting: Diagnostic Neuroimaging

## 2018-10-18 DIAGNOSIS — R9389 Abnormal findings on diagnostic imaging of other specified body structures: Secondary | ICD-10-CM

## 2018-10-18 NOTE — Telephone Encounter (Signed)
Pt called wanting to know if she is to do her neck scans again before her scheduled appt. Please advise

## 2018-10-22 NOTE — Telephone Encounter (Signed)
MRI ordered. -VRP 

## 2018-10-23 NOTE — Telephone Encounter (Signed)
Called patient and informed her that Dr Marjory Lies ordered her MRI brain. She will be getting a call to schedule it once her insurance approves it. She verbalized understanding, appreciation.

## 2018-10-24 ENCOUNTER — Telehealth: Payer: Self-pay | Admitting: Diagnostic Neuroimaging

## 2018-10-24 DIAGNOSIS — Z0000 Encounter for general adult medical examination without abnormal findings: Secondary | ICD-10-CM | POA: Diagnosis not present

## 2018-10-24 DIAGNOSIS — R7309 Other abnormal glucose: Secondary | ICD-10-CM | POA: Diagnosis not present

## 2018-10-24 NOTE — Telephone Encounter (Signed)
[employer] Auth: [id number] (exp. 10/24/18 to 04/21/19) order sent to GI. They will reach out to the patient to schedule.

## 2018-11-07 ENCOUNTER — Ambulatory Visit
Admission: RE | Admit: 2018-11-07 | Discharge: 2018-11-07 | Disposition: A | Discharge status: Home / Self Care | Payer: BC Managed Care – PPO | Source: Ambulatory Visit | Attending: Diagnostic Neuroimaging | Admitting: Diagnostic Neuroimaging

## 2018-11-07 DIAGNOSIS — R9389 Abnormal findings on diagnostic imaging of other specified body structures: Secondary | ICD-10-CM

## 2018-11-07 MED ORDER — GADOBENATE DIMEGLUMINE 529 MG/ML IV SOLN
20.0000 mL | Freq: Once | INTRAVENOUS | Status: AC | PRN
  Administered 2018-11-07: 20 mL via INTRAVENOUS

## 2018-11-12 ENCOUNTER — Telehealth: Payer: Self-pay | Admitting: *Deleted

## 2018-11-12 NOTE — Telephone Encounter (Signed)
Spoke with patient and informed her that her MRI brain showed stable imaging results with no new findings. She has FU 12/03/18; she asked if anything can be done about her hand numbness before then. I advised she monitor; I put her on wait list for sooner FU. She  verbalized understanding, appreciation.

## 2018-12-03 ENCOUNTER — Ambulatory Visit: Payer: BC Managed Care – PPO | Admitting: Diagnostic Neuroimaging

## 2018-12-03 ENCOUNTER — Encounter: Payer: Self-pay | Admitting: Diagnostic Neuroimaging

## 2018-12-03 ENCOUNTER — Other Ambulatory Visit: Payer: Self-pay

## 2018-12-03 ENCOUNTER — Encounter

## 2018-12-03 VITALS — BP 147/90 | HR 66 | Temp 96.8°F | Ht 66.0 in | Wt 205.5 lb

## 2018-12-03 DIAGNOSIS — G373 Acute transverse myelitis in demyelinating disease of central nervous system: Secondary | ICD-10-CM | POA: Diagnosis not present

## 2018-12-03 DIAGNOSIS — R200 Anesthesia of skin: Secondary | ICD-10-CM

## 2018-12-03 DIAGNOSIS — R202 Paresthesia of skin: Secondary | ICD-10-CM

## 2018-12-03 NOTE — Progress Notes (Signed)
[facility]  PATIENT: Cynthia JensenJanice R Villegas DOB: Jan 25, 1961  REFERRING CLINICIAN: D Brooks HISTORY FROM: patient  REASON FOR VISIT: follow up   HISTORICAL  CHIEF COMPLAINT:  Chief Complaint  Patient presents with  . Transverse Myelitis    rm 7 FU, "left arm / finger numbness"    HISTORY OF PRESENT ILLNESS:   UPDATE (12/03/18, VRP): Since last visit, doing well. Symptoms are stable. Severity is mild. No alleviating or aggravating factors. Notes some persistent left > right hand finger numbness. No other new sxs.    UPDATE (05/28/18, VRP): Since last visit, left arm numbness is stable. Low back pain slightly worse, but patient feels this is related to inactivity and slight weight gain. Severity is mild. No alleviating or aggravating factors.    UPDATE (03/05/18, VRP): Since last visit, patient is here to review test results.  CSF analysis shows greater than 5 oligoclonal bands.  Symptoms and lower extremity's are improving.  Symptoms in the left hand are persistent.  Patient also having some neck stiffness.  She has tried physical therapy which has helped.  Groin and genital numbness has resolved.  No issues with incontinence.  PRIOR HPI (12/19/17): 58 year old female here for evaluation of numbness.   11/28/2017 patient noticed rectal numbness when using the bathroom.  [facility] and had MRI lumbar spine --> showed some degenerative changes and narrowing. Then had ER visit on 12/09/17, had MRI of the thoracic and lumbar spine --> moderate to severe spinal stenosis at L3-4 and moderate spinal stenosis at L4-5.  No thoracic spinal cord compression.  Patient followed up with orthopedic surgery clinic.  Patient was referred here for second opinion.  No bowel or bladder incontinence.  Groin and genital numbness has improved.  She still has low back pain and leg pain which is exacerbated by standing and walking.  Numbness in her feet and legs are stable.  Also  has noted some numbness in her left hand.   REVIEW OF SYSTEMS: Full 14 system review of systems performed and negative with exception of: as per HPI.    ALLERGIES: No Known Allergies  HOME MEDICATIONS: Outpatient Medications Prior to Visit  Medication Sig Dispense Refill  . aspirin EC 81 MG tablet Take 81 mg by mouth daily.    Marland Kitchen. ibuprofen (ADVIL,MOTRIN) 200 MG tablet Take 200 mg by mouth every 6 (six) hours as needed.    Marland Kitchen. levothyroxine (SYNTHROID, LEVOTHROID) 100 MCG tablet Take 100 mcg by mouth daily before breakfast.    . gabapentin (NEURONTIN) 300 MG capsule 03/06/18 has never taken this  0   No facility-administered medications prior to visit.      PHYSICAL EXAM  GENERAL EXAM/CONSTITUTIONAL: Vitals:  Vitals:   12/03/18 1050  BP: (!) 147/90  Pulse: 66  Temp: (!) 96.8 F (36 C)  Weight: 205 lb 8 oz (93.2 kg)  Height: 5\' 6"  (1.676 m)   Body mass index is 33.17 kg/m. Wt Readings from Last 7 Encounters:  12/03/18 205 lb 8 oz (93.2 kg)  05/28/18 210 lb 6.4 oz (95.4 kg)  03/06/18 201 lb 9.6 oz (91.4 kg)  12/19/17 200 lb (90.7 kg)  12/09/17 202 lb (91.6 kg)  09/10/12 202 lb (91.6 kg)  08/01/12 200 lb 13.4 oz (91.1 kg)    Patient is in no distress; well developed, nourished and groomed; neck is supple  CARDIOVASCULAR:  Examination of carotid arteries is normal; no carotid bruits  Regular rate and rhythm, no  murmurs  Examination of peripheral vascular system by observation and palpation is normal  EYES:  Ophthalmoscopic exam of optic discs and posterior segments is normal; no papilledema or hemorrhages No exam data present  MUSCULOSKELETAL:  Gait, strength, tone, movements noted in Neurologic exam below  NEUROLOGIC: MENTAL STATUS:  No flowsheet data found.  awake, alert, oriented to person, place and time  recent and remote memory intact  normal attention and concentration  language fluent, comprehension intact, naming intact  fund of knowledge  appropriate  CRANIAL NERVE:   2nd - no papilledema on fundoscopic exam  2nd, 3rd, 4th, 6th - pupils equal and reactive to light, visual fields full to confrontation, extraocular muscles intact, no nystagmus  5th - facial sensation symmetric  7th - facial strength symmetric  8th - hearing intact  9th - palate elevates symmetrically, uvula midline  11th - shoulder shrug symmetric  12th - tongue protrusion midline  MOTOR:   normal bulk and tone, full strength in the BUE, BLE; SLIGHT ATROPHY OF LEFT APB  SENSORY:   normal and symmetric to light touch, temperature, vibration  NEG PHALEN; NEG TINEL  COORDINATION:   finger-nose-finger, fine finger movements normal  REFLEXES:   deep tendon reflexes present and symmetric  GAIT/STATION:   narrow based gait     DIAGNOSTIC DATA (LABS, IMAGING, TESTING) - I reviewed patient records, labs, notes, testing and imaging myself where available.  Lab Results  Component Value Date   WBC 8.7 12/09/2017   HGB 13.4 12/09/2017   HCT 39.8 12/09/2017   MCV 88.6 12/09/2017   PLT 222 12/09/2017      Component Value Date/Time   NA 142 12/09/2017 0104   K 3.1 (L) 12/09/2017 0104   CL 103 12/09/2017 0104   CO2 31 12/09/2017 0104   GLUCOSE 128 (H) 12/09/2017 0104   BUN 9 12/09/2017 0104   CREATININE 0.78 12/09/2017 0104   CALCIUM 9.0 12/09/2017 0104   PROT 7.0 08/01/2012 0620   ALBUMIN 3.2 (L) 08/01/2012 0620   AST 32 08/01/2012 0620   ALT 38 (H) 08/01/2012 0620   ALKPHOS 80 08/01/2012 0620   BILITOT 0.6 08/01/2012 0620   GFRNONAA >60 12/09/2017 0104   GFRAA >60 12/09/2017 0104   No results found for: CHOL, HDL, LDLCALC, LDLDIRECT, TRIG, CHOLHDL No results found for: [id number] Lab Results  Component Value Date   VITAMINB12 447 12/09/2017   Lab Results  Component Value Date   TSH 2.364 12/09/2017    12/09/17 MR THORACIC SPINE [I reviewed images myself and agree with interpretation. -VRP]  - MRI thoracic spine  demonstrates multiple small disc protrusions, but no significant spinal stenosis, or cord compression. LEFT T10-11 foraminal narrowing, uncertain significance. - No thoracic spine fracture, intrinsic cord abnormality, visible hydromyelia, or intraspinal mass lesion.  12/09/17 MR LUMBAR SPINE [I reviewed images myself and agree with interpretation. L3-4 moderate-severe spinal stenosis; L4-5 moderate spinal stenosis. -VRP]  - Transitional anatomy.  L5-S1 disc is hypoplastic. 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 Disc pathology, short pedicles, and posterior element hypertrophy contribute to possible subarticular zone narrowing at multiple levels. See discussion above. - There are no areas of critical spinal stenosis to suggest cauda equina syndrome, although the degree of stenosis at L3-4 is moderate to severe.  12/09/17 labs -Vitamin B12, RPR, copper, HIV, ceruloplasmin, and NMO antibody --> negative  01/02/18 MRI brain [I reviewed images myself and agree with interpretation. -VRP]  1.  There are a couple punctate T2/FLAIR hypertense foci in the subcortical white matter.  This is a nonspecific finding, common for age and likely representing minimal chronic microvascular ischemic changes. 2.   There is a normal enhancement pattern and there are no acute findings.  01/02/18 MRI cervical spine [I reviewed images myself and agree with interpretation. -VRP]  1.    There is an enhancing focus in the posterior spinal cord adjacent to C5 that slightly expands the spinal cord.  It measures 12 mm longitudinally on the postcontrast images.  Although nonspecific, this most likely represents an inflammatory or autoimmune transverse myelitis.  Neoplasm cannot be ruled out and a follow-up evaluation in a couple months is recommended. 2.    There are degenerative changes at C4-C5, C5-C6. C6-C7 and T2-T3 that do not lead to any nerve root compression.   01/02/18 MRI thoracic spine [I reviewed images myself and agree with interpretation. -VRP]  1.    The spinal cord appears normal. 2.    Multilevel degenerative changes as detailed above.  There is foraminal narrowing to the left at T10-11 encroaching upon the left T10 nerve root without causing definite nerve root compression. 3.    There is a normal enhancement pattern.  03/27/18 MRI cervical [I reviewed images myself and agree with interpretation. -VRP]  - Improving spinal cord lesion at C4-5; slightly reduced size and significantly reduced enhancement. Stable lesion at C5-6. Findings are consistent with autoimmune or inflammatory process such as idiopathic transverse myelitis. - At C4-5: disc bulging and facet hypertrophy with moderate right foraminal stenosis; posterior central intrinsic T2 hyperintense spinal cord lesion noted. - At C5-6: disc bulging and facet hypertrophy with mild biforaminal stenosis; subtle right lateral spinal cord T2 hyperintensity also noted, stable from prior study.  11/06/17 MRI brain  - Few punctate subcortical foci of non-specific gliosis.  - No abnormal enhancing lesions. - No change from MRI on 01/02/18.  02/18/18 CSF - The patient's CSF contains >5 well defined gamma restriction bands that are not present in the patient's corresponding serum sample. - WBC 6, RBC 47, glucose 51, protein 69 - cytology: negative for malignancy    ASSESSMENT AND PLAN  58 y.o. year old female here with new onset numbness from mid torso down to bilateral lower extremities on November 28, 2016 associated with genital and groin numbness, now gradually improving.  Neurologic examination notable for decreased sensation in the lower extremities, brisk reflexes at the knees, and unsteady gait.  MRI of the thoracic spine was reviewed and no evidence of intrinsic spinal cord lesions.  MRI of the lumbar spine shows significant spinal stenosis which could account for patient's gait difficulty.   However patient does report numbness in left hand and resolving perineal numbness which are otherwise not well explained.  Therefore we did further work-up with additional CNS imaging studies --> enhancing cervical spinal cord lesion found, with + OCB on CSF testing. 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 Transverse myelitis (HCC)   2. Numbness and tingling      PLAN:  TRANSVERSE MYELITIS (idiopathic) - monitor; repeat MRI brain in summer 2021 (surveillance for multiple sclerosis)  LUMBAR SPINAL STENOSIS - conservative mgmt for now; exercise, nutrition reviewed; follow up with orthopedic clinic (Dr. Shon BatonBrooks)  HAND NUMBNESS  - NCV / EMG for CTS eval  Orders Placed This Encounter  Procedures  . NCV with EMG(electromyography)   Return for for  NCV/EMG; also revisit in 8 months.    Penni Bombard, MD 3/89/3734, 28:76 AM Certified in Neurology, Neurophysiology and Neuroimaging  [facility] [address] [place], [place] [address] [phone]

## 2018-12-16 DIAGNOSIS — H35413 Lattice degeneration of retina, bilateral: Secondary | ICD-10-CM | POA: Diagnosis not present

## 2018-12-16 DIAGNOSIS — H5213 Myopia, bilateral: Secondary | ICD-10-CM | POA: Diagnosis not present

## 2018-12-16 DIAGNOSIS — H33323 Round hole, bilateral: Secondary | ICD-10-CM | POA: Diagnosis not present

## 2018-12-16 DIAGNOSIS — H31093 Other chorioretinal scars, bilateral: Secondary | ICD-10-CM | POA: Diagnosis not present

## 2019-01-09 ENCOUNTER — Encounter: Payer: BC Managed Care – PPO | Admitting: Diagnostic Neuroimaging

## 2019-01-09 ENCOUNTER — Other Ambulatory Visit: Payer: Self-pay

## 2019-01-09 ENCOUNTER — Ambulatory Visit (INDEPENDENT_AMBULATORY_CARE_PROVIDER_SITE_OTHER): Payer: BC Managed Care – PPO | Admitting: Diagnostic Neuroimaging

## 2019-01-09 DIAGNOSIS — R200 Anesthesia of skin: Secondary | ICD-10-CM

## 2019-01-09 DIAGNOSIS — Z0289 Encounter for other administrative examinations: Secondary | ICD-10-CM

## 2019-01-09 DIAGNOSIS — R202 Paresthesia of skin: Secondary | ICD-10-CM | POA: Diagnosis not present

## 2019-01-16 NOTE — Procedures (Signed)
   [facility]  NCS (NERVE CONDUCTION STUDY) WITH EMG (ELECTROMYOGRAPHY) REPORT   STUDY DATE: 01/09/19 PATIENT NAME: [name] DOB: 1960/12/25 MRN: [id number]  ORDERING CLINICIAN: Andrey Spearman, MD   TECHNOLOGIST: Sherre Scarlet ELECTROMYOGRAPHER: Earlean Polka. Penumalli, MD  CLINICAL INFORMATION: 58 year old female with left hand numbness.  FINDINGS: NERVE CONDUCTION STUDY: Left median and ulnar motor responses are normal.  Left median and ulnar sensory responses are normal.  Left ulnar F wave latency is normal.   NEEDLE ELECTROMYOGRAPHY:  Needle examination of left upper extremity deltoid, biceps, triceps, flexor carpi radialis, first dorsal interosseous is normal.   IMPRESSION:   This is a normal study.  No electrodiagnostic evidence of large fiber neuropathy at this time.    INTERPRETING PHYSICIAN:  Penni Bombard, MD Certified in Neurology, Neurophysiology and Neuroimaging  [facility] [address] [place], [place] [address] [phone]  [facility]    Nerve / Sites Muscle Latency Ref. Amplitude Ref. Rel Amp Segments Distance Velocity Ref. Area    ms ms mV mV %  cm m/s m/s mVms  L Median - APB     Wrist APB 3.4 ?4.4 9.1 ?4.0 100 Wrist - APB 7   34.8     Upper arm APB 7.2  8.5  92.6 Upper arm - Wrist 22 57 ?49 33.2  L Ulnar - ADM     Wrist ADM 2.4 ?3.3 6.5 ?6.0 100 Wrist - ADM 7   32.3     B.Elbow ADM 6.5  6.0  91.7 B.Elbow - Wrist 22 54 ?49 30.9     A.Elbow ADM 8.5  5.5  92.2 A.Elbow - B.Elbow 10 49 ?49 27.8         A.Elbow - Wrist             SNC    Nerve / Sites Rec. Site Peak Lat Ref.  Amp Ref. Segments Distance    ms ms V V  cm  L Median - Orthodromic (Dig II, Mid palm)     Dig II Wrist 3.0 ?3.4 22 ?10 Dig II - Wrist 13  L Ulnar - Orthodromic, (Dig V, Mid palm)     Dig V Wrist 2.7 ?3.1 5 ?5 Dig V - Wrist 41         F  Wave    Nerve F Lat Ref.   ms ms  L Ulnar - ADM 25.3 ?32.0       EMG full        EMG Summary Table    Spontaneous MUAP Recruitment  Muscle IA Fib PSW Fasc Other Amp Dur. Poly Pattern  L. Deltoid Normal None None None _______ Normal Normal Normal Normal  L. Biceps brachii Normal None None None _______ Normal Normal Normal Normal  L. Triceps brachii Normal None None None _______ Normal Normal Normal Normal  L. Flexor carpi radialis Normal None None None _______ Normal Normal Normal Normal  L. First dorsal interosseous Normal None None None _______ Normal Normal Normal Normal

## 2019-01-29 DIAGNOSIS — H33311 Horseshoe tear of retina without detachment, right eye: Secondary | ICD-10-CM | POA: Diagnosis not present

## 2019-01-29 DIAGNOSIS — H35413 Lattice degeneration of retina, bilateral: Secondary | ICD-10-CM | POA: Diagnosis not present

## 2019-01-29 DIAGNOSIS — H5319 Other subjective visual disturbances: Secondary | ICD-10-CM | POA: Diagnosis not present

## 2019-01-29 DIAGNOSIS — H33323 Round hole, bilateral: Secondary | ICD-10-CM | POA: Diagnosis not present

## 2019-01-31 DIAGNOSIS — H33321 Round hole, right eye: Secondary | ICD-10-CM | POA: Diagnosis not present

## 2019-02-14 DIAGNOSIS — H35412 Lattice degeneration of retina, left eye: Secondary | ICD-10-CM | POA: Diagnosis not present

## 2019-03-10 DIAGNOSIS — M25511 Pain in right shoulder: Secondary | ICD-10-CM | POA: Diagnosis not present

## 2019-03-19 DIAGNOSIS — Z803 Family history of malignant neoplasm of breast: Secondary | ICD-10-CM | POA: Diagnosis not present

## 2019-03-19 DIAGNOSIS — Z1231 Encounter for screening mammogram for malignant neoplasm of breast: Secondary | ICD-10-CM | POA: Diagnosis not present

## 2019-05-02 DIAGNOSIS — H35411 Lattice degeneration of retina, right eye: Secondary | ICD-10-CM | POA: Diagnosis not present

## 2019-05-02 DIAGNOSIS — H5319 Other subjective visual disturbances: Secondary | ICD-10-CM | POA: Diagnosis not present

## 2019-05-02 DIAGNOSIS — H5213 Myopia, bilateral: Secondary | ICD-10-CM | POA: Diagnosis not present

## 2019-05-02 DIAGNOSIS — H31093 Other chorioretinal scars, bilateral: Secondary | ICD-10-CM | POA: Diagnosis not present

## 2019-08-06 ENCOUNTER — Other Ambulatory Visit: Payer: Self-pay

## 2019-08-06 ENCOUNTER — Ambulatory Visit: Payer: BC Managed Care – PPO | Admitting: Diagnostic Neuroimaging

## 2019-08-06 ENCOUNTER — Encounter: Payer: Self-pay | Admitting: Diagnostic Neuroimaging

## 2019-08-06 VITALS — BP 127/84 | HR 66 | Temp 97.8°F | Ht 67.0 in | Wt 202.6 lb

## 2019-08-06 DIAGNOSIS — R9389 Abnormal findings on diagnostic imaging of other specified body structures: Secondary | ICD-10-CM

## 2019-08-06 DIAGNOSIS — G373 Acute transverse myelitis in demyelinating disease of central nervous system: Secondary | ICD-10-CM | POA: Diagnosis not present

## 2019-08-06 NOTE — Progress Notes (Signed)
[facility]  PATIENT: Cynthia Villegas DOB: [birth year]  REFERRING CLINICIAN: D Brooks HISTORY FROM: patient  REASON FOR VISIT: follow up   HISTORICAL  CHIEF COMPLAINT:  Chief Complaint  Patient presents with  . Transverse myelitis    rm 6, 8 month FU "my left hand hasn't improved at all; other issues are stable; feels like I have fluid behind my ear"    HISTORY OF PRESENT ILLNESS:   UPDATE (08/06/19, VRP): Since last visit, doing about the same. Symptoms are stable. No alleviating or aggravating factors. No new issues.    UPDATE (12/03/18, VRP): Since last visit, doing well. Symptoms are stable. Severity is mild. No alleviating or aggravating factors. Notes some persistent left > right hand finger numbness. No other new sxs.    UPDATE (05/28/18, VRP): Since last visit, left arm numbness is stable. Low back pain slightly worse, but patient feels this is related to inactivity and slight weight gain. Severity is mild. No alleviating or aggravating factors.    UPDATE (03/05/18, VRP): Since last visit, patient is here to review test results.  CSF analysis shows greater than 5 oligoclonal bands.  Symptoms and lower extremity's are improving.  Symptoms in the left hand are persistent.  Patient also having some neck stiffness.  She has tried physical therapy which has helped.  Groin and genital numbness has resolved.  No issues with incontinence.  PRIOR HPI (12/19/17): 59 year old female here for evaluation of numbness.   11/28/2017 patient noticed rectal numbness when using the bathroom.  Patient then went to [facility] ER and had MRI lumbar spine --> showed some degenerative changes and narrowing. Then had ER visit on 12/09/17, had MRI of the thoracic and lumbar spine --> moderate to severe spinal stenosis at L3-4 and moderate spinal stenosis at L4-5.  No thoracic spinal cord compression.  Patient followed up with orthopedic surgery clinic.  Patient was referred here for second  opinion.  No bowel or bladder incontinence.  Groin and genital numbness has improved.  She still has low back pain and leg pain which is exacerbated by standing and walking.  Numbness in her feet and legs are stable.  Also has noted some numbness in her left hand.   REVIEW OF SYSTEMS: Full 14 system review of systems performed and negative with exception of: as per HPI.   ALLERGIES: No Known Allergies  HOME MEDICATIONS: Outpatient Medications Prior to Visit  Medication Sig Dispense Refill  . aspirin EC 81 MG tablet Take 81 mg by mouth daily.    Marland Kitchen ibuprofen (ADVIL,MOTRIN) 200 MG tablet Take 200 mg by mouth every 6 (six) hours as needed.    Marland Kitchen levothyroxine (SYNTHROID, LEVOTHROID) 100 MCG tablet Take 100 mcg by mouth daily before breakfast.    . gabapentin (NEURONTIN) 300 MG capsule 03/06/18 has never taken this  0   No facility-administered medications prior to visit.     PHYSICAL EXAM  GENERAL EXAM/CONSTITUTIONAL: Vitals:  Vitals:   08/06/19 1324  BP: 127/84  Pulse: 66  Temp: 97.8 F (36.6 C)  Weight: 202 lb 9.6 oz (91.9 kg)  Height: 5\' 7"  (1.702 m)   Body mass index is 31.73 kg/m. Wt Readings from Last 7 Encounters:  08/06/19 202 lb 9.6 oz (91.9 kg)  12/03/18 205 lb 8 oz (93.2 kg)  05/28/18 210 lb 6.4 oz (95.4 kg)  03/06/18 201 lb 9.6 oz (91.4 kg)  12/19/17 200 lb (90.7 kg)  12/09/17 202 lb (91.6 kg)  09/10/12 202 lb (  91.6 kg)    Patient is in no distress; well developed, nourished and groomed; neck is supple  CARDIOVASCULAR:  Examination of carotid arteries is normal; no carotid bruits  Regular rate and rhythm, no murmurs  Examination of peripheral vascular system by observation and palpation is normal  EYES:  Ophthalmoscopic exam of optic discs and posterior segments is normal; no papilledema or hemorrhages No exam data present  MUSCULOSKELETAL:  Gait, strength, tone, movements noted in Neurologic exam below  NEUROLOGIC: MENTAL STATUS:  No  flowsheet data found.  awake, alert, oriented to person, place and time  recent and remote memory intact  normal attention and concentration  language fluent, comprehension intact, naming intact  fund of knowledge appropriate  CRANIAL NERVE:   2nd - no papilledema on fundoscopic exam  2nd, 3rd, 4th, 6th - pupils equal and reactive to light, visual fields full to confrontation, extraocular muscles intact, no nystagmus  5th - facial sensation symmetric  7th - facial strength symmetric  8th - hearing intact  9th - palate elevates symmetrically, uvula midline  11th - shoulder shrug symmetric  12th - tongue protrusion midline  MOTOR:   normal bulk and tone, full strength in the BUE, BLE  SENSORY:   normal and symmetric to light touch, temperature, vibration  NEG PHALEN; NEG TINEL  COORDINATION:   finger-nose-finger, fine finger movements normal  REFLEXES:   deep tendon reflexes present and symmetric  GAIT/STATION:   narrow based gait     DIAGNOSTIC DATA (LABS, IMAGING, TESTING) - I reviewed patient records, labs, notes, testing and imaging myself where available.  Lab Results  Component Value Date   WBC 8.7 12/09/2017   HGB 13.4 12/09/2017   HCT 39.8 12/09/2017   MCV 88.6 12/09/2017   PLT 222 12/09/2017      Component Value Date/Time   NA 142 12/09/2017 0104   K 3.1 (L) 12/09/2017 0104   CL 103 12/09/2017 0104   CO2 31 12/09/2017 0104   GLUCOSE 128 (H) 12/09/2017 0104   BUN 9 12/09/2017 0104   CREATININE 0.78 12/09/2017 0104   CALCIUM 9.0 12/09/2017 0104   PROT 7.0 08/01/2012 0620   ALBUMIN 3.2 (L) 08/01/2012 0620   AST 32 08/01/2012 0620   ALT 38 (H) 08/01/2012 0620   ALKPHOS 80 08/01/2012 0620   BILITOT 0.6 08/01/2012 0620   GFRNONAA >60 12/09/2017 0104   GFRAA >60 12/09/2017 0104   No results found for: CHOL, HDL, LDLCALC, LDLDIRECT, TRIG, CHOLHDL No results found for: ZESP2Z Lab Results  Component Value Date   VITAMINB12 447  12/09/2017   Lab Results  Component Value Date   TSH 2.364 12/09/2017    12/09/17 MR THORACIC SPINE [I reviewed images myself and agree with interpretation. -VRP]  - MRI thoracic spine demonstrates multiple small disc protrusions, but no significant spinal stenosis, or cord compression. LEFT T10-11 foraminal narrowing, uncertain significance. - No thoracic spine fracture, intrinsic cord abnormality, visible hydromyelia, or intraspinal mass lesion.  12/09/17 MR LUMBAR SPINE [I reviewed images myself and agree with interpretation. L3-4 moderate-severe spinal stenosis; L4-5 moderate spinal stenosis. -VRP]  - Transitional anatomy.  L5-S1 disc is hypoplastic. 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 Disc pathology, short pedicles, and posterior element hypertrophy contribute to possible subarticular zone narrowing at multiple levels. See discussion above. - There are no areas of critical spinal stenosis to suggest cauda equina syndrome, although the degree of stenosis at L3-4 is moderate to  severe.  12/09/17 labs -Vitamin B12, RPR, copper, HIV, ceruloplasmin, and NMO antibody --> negative  01/02/18 MRI brain [I reviewed images myself and agree with interpretation. -VRP]  1.   There are a couple punctate T2/FLAIR hypertense foci in the subcortical white matter.  This is a nonspecific finding, common for age and likely representing minimal chronic microvascular ischemic changes. 2.   There is a normal enhancement pattern and there are no acute findings.  01/02/18 MRI cervical spine [I reviewed images myself and agree with interpretation. -VRP]  1.    There is an enhancing focus in the posterior spinal cord adjacent to C5 that slightly expands the spinal cord.  It measures 12 mm longitudinally on the postcontrast images.  Although nonspecific, this most likely represents an inflammatory or autoimmune transverse myelitis.  Neoplasm cannot be ruled  out and a follow-up evaluation in a couple months is recommended. 2.    There are degenerative changes at C4-C5, C5-C6. C6-C7 and T2-T3 that do not lead to any nerve root compression.  01/02/18 MRI thoracic spine [I reviewed images myself and agree with interpretation. -VRP]  1.    The spinal cord appears normal. 2.    Multilevel degenerative changes as detailed above.  There is foraminal narrowing to the left at T10-11 encroaching upon the left T10 nerve root without causing definite nerve root compression. 3.    There is a normal enhancement pattern.  03/27/18 MRI cervical [I reviewed images myself and agree with interpretation. -VRP]  - Improving spinal cord lesion at C4-5; slightly reduced size and significantly reduced enhancement. Stable lesion at C5-6. Findings are consistent with autoimmune or inflammatory process such as idiopathic transverse myelitis. - At C4-5: disc bulging and facet hypertrophy with moderate right foraminal stenosis; posterior central intrinsic T2 hyperintense spinal cord lesion noted. - At C5-6: disc bulging and facet hypertrophy with mild biforaminal stenosis; subtle right lateral spinal cord T2 hyperintensity also noted, stable from prior study.  11/06/17 MRI brain  - Few punctate subcortical foci of non-specific gliosis.  - No abnormal enhancing lesions. - No change from MRI on 01/02/18.  11/07/18 MRI brain (with and without) demonstrating: - Few punctate subcortical foci of non-specific gliosis.  - No abnormal enhancing lesions. - No change from MRI on 01/02/18.  02/18/18 CSF - The patient's CSF contains >5 well defined gamma restriction bands that are not present in the patient's corresponding serum sample. - WBC 6, RBC 47, glucose 51, protein 69 - cytology: negative for malignancy  01/09/19 EMG / NCV (bilateral upper ext) - This is a normal study.  No electrodiagnostic evidence of large fiber neuropathy at this time.   ASSESSMENT AND PLAN  59 y.o. year  old female here with new onset numbness from mid torso down to bilateral lower extremities on November 28, 2016 associated with genital and groin numbness, now gradually improving.   Neurologic examination was notable for decreased sensation in the lower extremities, brisk reflexes at the knees, and unsteady gait.  MRI of the thoracic spine was reviewed and no evidence of intrinsic spinal cord lesions.  MRI of the lumbar spine shows significant spinal stenosis which could account for patient's gait difficulty.  However patient does report numbness in left hand and resolving perineal numbness which are otherwise not well explained.  Therefore we did further work-up with additional CNS imaging studies --> enhancing cervical spinal cord lesion found, with + OCB on CSF testing. 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 Transverse myelitis ([place])   2. Abnormal MRI     PLAN:  TRANSVERSE MYELITIS (idiopathic) - monitor; repeat MRI brain in summer 2021 (surveillance for multiple sclerosis)  LUMBAR SPINAL STENOSIS - conservative mgmt for now; exercise, nutrition reviewed; follow up with orthopedic clinic (Dr. Rolena Infante)  LEFT HAND NUMBNESS (stable) - likely  sequelae of transverse myelitis  Orders Placed This Encounter  Procedures  . MR BRAIN W WO CONTRAST   Return pending test results, for pending if symptoms worsen or fail to improve.    Penni Bombard, MD 0/76/2263, 3:35 PM Certified in Neurology, Neurophysiology and Neuroimaging  [facility] [address], [place], [place] [address] [phone]

## 2019-08-13 ENCOUNTER — Telehealth: Payer: Self-pay | Admitting: Diagnostic Neuroimaging

## 2019-08-13 NOTE — Telephone Encounter (Signed)
Spoke to the patient she stated she will call back to schedule  Cynthia Villegas Numbers: [id number] (exp 08/13/19-02/08/20).

## 2019-08-27 NOTE — Telephone Encounter (Signed)
I spoke to the patient she is scheduled at [facility] for 10/28/19.

## 2019-08-27 NOTE — Telephone Encounter (Signed)
Pt is asking for a call from [facility] to schedule her MRI

## 2019-10-21 DIAGNOSIS — E890 Postprocedural hypothyroidism: Secondary | ICD-10-CM | POA: Diagnosis not present

## 2019-10-21 DIAGNOSIS — Z0000 Encounter for general adult medical examination without abnormal findings: Secondary | ICD-10-CM | POA: Diagnosis not present

## 2019-10-21 DIAGNOSIS — R7303 Prediabetes: Secondary | ICD-10-CM | POA: Diagnosis not present

## 2019-10-21 DIAGNOSIS — R7309 Other abnormal glucose: Secondary | ICD-10-CM | POA: Diagnosis not present

## 2019-10-28 ENCOUNTER — Other Ambulatory Visit: Payer: BC Managed Care – PPO

## 2019-11-04 DIAGNOSIS — G373 Acute transverse myelitis in demyelinating disease of central nervous system: Secondary | ICD-10-CM | POA: Diagnosis not present

## 2019-11-04 DIAGNOSIS — F439 Reaction to severe stress, unspecified: Secondary | ICD-10-CM | POA: Diagnosis not present

## 2019-11-04 DIAGNOSIS — R7309 Other abnormal glucose: Secondary | ICD-10-CM | POA: Diagnosis not present

## 2019-11-04 DIAGNOSIS — Z0000 Encounter for general adult medical examination without abnormal findings: Secondary | ICD-10-CM | POA: Diagnosis not present

## 2019-11-04 DIAGNOSIS — E890 Postprocedural hypothyroidism: Secondary | ICD-10-CM | POA: Diagnosis not present

## 2019-11-04 DIAGNOSIS — M5126 Other intervertebral disc displacement, lumbar region: Secondary | ICD-10-CM | POA: Diagnosis not present

## 2019-12-16 ENCOUNTER — Ambulatory Visit: Payer: BC Managed Care – PPO

## 2019-12-16 DIAGNOSIS — G373 Acute transverse myelitis in demyelinating disease of central nervous system: Secondary | ICD-10-CM

## 2019-12-16 DIAGNOSIS — R9389 Abnormal findings on diagnostic imaging of other specified body structures: Secondary | ICD-10-CM | POA: Diagnosis not present

## 2019-12-16 MED ORDER — GADOBENATE DIMEGLUMINE 529 MG/ML IV SOLN
20.0000 mL | Freq: Once | INTRAVENOUS | Status: AC | PRN
  Administered 2019-12-16: 20 mL via INTRAVENOUS

## 2019-12-18 ENCOUNTER — Telehealth: Payer: Self-pay

## 2019-12-18 NOTE — Telephone Encounter (Signed)
I called pt. I discussed her MRI results and recommendations. Pt verbalized understanding of results. Pt had no questions at this time but was encouraged to call back if questions arise.

## 2019-12-18 NOTE — Telephone Encounter (Signed)
-----   Message from Suanne Marker, MD sent at 12/18/2019  9:04 AM EDT ----- Unremarkable imaging results. No change. Please call patient. Continue current plan. -VRP

## 2020-03-24 DIAGNOSIS — Z1231 Encounter for screening mammogram for malignant neoplasm of breast: Secondary | ICD-10-CM | POA: Diagnosis not present

## 2020-04-02 DIAGNOSIS — Z20822 Contact with and (suspected) exposure to covid-19: Secondary | ICD-10-CM | POA: Diagnosis not present

## 2020-04-30 DIAGNOSIS — H33323 Round hole, bilateral: Secondary | ICD-10-CM | POA: Diagnosis not present

## 2020-04-30 DIAGNOSIS — H31093 Other chorioretinal scars, bilateral: Secondary | ICD-10-CM | POA: Diagnosis not present

## 2020-04-30 DIAGNOSIS — H35413 Lattice degeneration of retina, bilateral: Secondary | ICD-10-CM | POA: Diagnosis not present

## 2020-04-30 DIAGNOSIS — H35363 Drusen (degenerative) of macula, bilateral: Secondary | ICD-10-CM | POA: Diagnosis not present

## 2020-08-14 IMAGING — XA DG FLUORO GUIDE LUMBAR PUNCTURE
1 series · 1 of 1 positions shown · non-contrast
Comparison: none

CLINICAL DATA: Cervical spinal cord lesion.  Numbness.

[Series 1: ortho adipose · 1 of 1 slices shown]
[im 1/1]
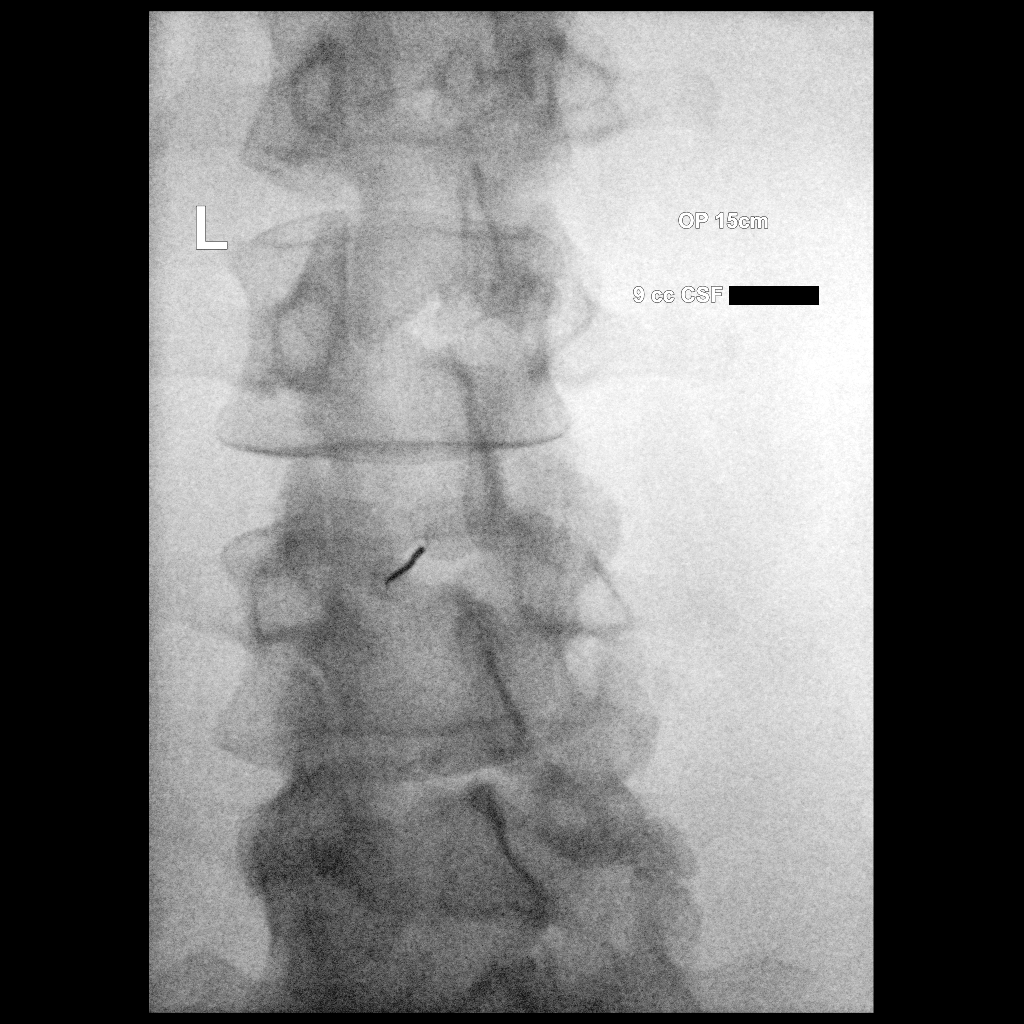

[1 of 1 positions shown; findings below may reference images not displayed]

EXAM:
DIAGNOSTIC LUMBAR PUNCTURE UNDER FLUOROSCOPIC GUIDANCE

FLUOROSCOPY TIME:  Fluoroscopy Time:  6 seconds

Radiation Exposure Index (if provided by the fluoroscopic device):
22.13 microGray*m^2

Number of Acquired Spot Images: 0

PROCEDURE:
Informed consent was obtained from the patient prior to the
procedure, including potential complications of headache, allergy,
and pain. With the patient prone, the lower back was prepped with
Betadine. 1% Lidocaine was used for local anesthesia. Lumbar
puncture was performed at the L2-3 level using a 6 inch 20 gaugee
needle via a left interlaminar approach with return of clear CSF
with an opening pressure of 15 cm water (measured in the left
lateral decubitus position). 9 mL of CSF were obtained for
laboratory studies. The patient tolerated the procedure well and
there were no apparent complications.
IMPRESSION: Successful fluoroscopic guided lumbar puncture.

## 2020-08-17 DIAGNOSIS — E890 Postprocedural hypothyroidism: Secondary | ICD-10-CM | POA: Diagnosis not present

## 2020-08-17 DIAGNOSIS — R5383 Other fatigue: Secondary | ICD-10-CM | POA: Diagnosis not present

## 2020-08-17 DIAGNOSIS — G4700 Insomnia, unspecified: Secondary | ICD-10-CM | POA: Diagnosis not present

## 2020-10-05 DIAGNOSIS — Z20822 Contact with and (suspected) exposure to covid-19: Secondary | ICD-10-CM | POA: Diagnosis not present

## 2020-11-01 DIAGNOSIS — R7309 Other abnormal glucose: Secondary | ICD-10-CM | POA: Diagnosis not present

## 2020-11-01 DIAGNOSIS — E890 Postprocedural hypothyroidism: Secondary | ICD-10-CM | POA: Diagnosis not present

## 2020-11-01 DIAGNOSIS — Z0000 Encounter for general adult medical examination without abnormal findings: Secondary | ICD-10-CM | POA: Diagnosis not present

## 2020-11-08 DIAGNOSIS — Z0000 Encounter for general adult medical examination without abnormal findings: Secondary | ICD-10-CM | POA: Diagnosis not present

## 2020-11-08 DIAGNOSIS — G373 Acute transverse myelitis in demyelinating disease of central nervous system: Secondary | ICD-10-CM | POA: Diagnosis not present

## 2020-11-08 DIAGNOSIS — E890 Postprocedural hypothyroidism: Secondary | ICD-10-CM | POA: Diagnosis not present

## 2020-11-08 DIAGNOSIS — M5126 Other intervertebral disc displacement, lumbar region: Secondary | ICD-10-CM | POA: Diagnosis not present

## 2021-01-19 DIAGNOSIS — H5213 Myopia, bilateral: Secondary | ICD-10-CM | POA: Diagnosis not present

## 2021-01-19 DIAGNOSIS — H35413 Lattice degeneration of retina, bilateral: Secondary | ICD-10-CM | POA: Diagnosis not present

## 2021-01-19 DIAGNOSIS — H2513 Age-related nuclear cataract, bilateral: Secondary | ICD-10-CM | POA: Diagnosis not present

## 2021-01-19 DIAGNOSIS — H524 Presbyopia: Secondary | ICD-10-CM | POA: Diagnosis not present

## 2021-03-30 DIAGNOSIS — Z1231 Encounter for screening mammogram for malignant neoplasm of breast: Secondary | ICD-10-CM | POA: Diagnosis not present

## 2021-05-09 DIAGNOSIS — H35413 Lattice degeneration of retina, bilateral: Secondary | ICD-10-CM | POA: Diagnosis not present

## 2021-05-09 DIAGNOSIS — H353131 Nonexudative age-related macular degeneration, bilateral, early dry stage: Secondary | ICD-10-CM | POA: Diagnosis not present

## 2021-05-09 DIAGNOSIS — H31093 Other chorioretinal scars, bilateral: Secondary | ICD-10-CM | POA: Diagnosis not present

## 2021-05-09 DIAGNOSIS — H35363 Drusen (degenerative) of macula, bilateral: Secondary | ICD-10-CM | POA: Diagnosis not present

## 2021-06-22 DIAGNOSIS — M542 Cervicalgia: Secondary | ICD-10-CM | POA: Diagnosis not present

## 2021-06-22 DIAGNOSIS — M5416 Radiculopathy, lumbar region: Secondary | ICD-10-CM | POA: Diagnosis not present

## 2021-07-19 DIAGNOSIS — M5451 Vertebrogenic low back pain: Secondary | ICD-10-CM | POA: Diagnosis not present

## 2021-07-19 DIAGNOSIS — M542 Cervicalgia: Secondary | ICD-10-CM | POA: Diagnosis not present

## 2021-07-26 DIAGNOSIS — M542 Cervicalgia: Secondary | ICD-10-CM | POA: Diagnosis not present

## 2021-07-26 DIAGNOSIS — M5451 Vertebrogenic low back pain: Secondary | ICD-10-CM | POA: Diagnosis not present

## 2021-08-02 DIAGNOSIS — M542 Cervicalgia: Secondary | ICD-10-CM | POA: Diagnosis not present

## 2021-08-02 DIAGNOSIS — M5451 Vertebrogenic low back pain: Secondary | ICD-10-CM | POA: Diagnosis not present

## 2021-08-11 DIAGNOSIS — M542 Cervicalgia: Secondary | ICD-10-CM | POA: Diagnosis not present

## 2021-08-11 DIAGNOSIS — M5451 Vertebrogenic low back pain: Secondary | ICD-10-CM | POA: Diagnosis not present

## 2021-09-21 DIAGNOSIS — R599 Enlarged lymph nodes, unspecified: Secondary | ICD-10-CM | POA: Diagnosis not present

## 2021-09-26 DIAGNOSIS — H35363 Drusen (degenerative) of macula, bilateral: Secondary | ICD-10-CM | POA: Diagnosis not present

## 2021-09-26 DIAGNOSIS — H5319 Other subjective visual disturbances: Secondary | ICD-10-CM | POA: Diagnosis not present

## 2021-09-26 DIAGNOSIS — H43391 Other vitreous opacities, right eye: Secondary | ICD-10-CM | POA: Diagnosis not present

## 2021-09-26 DIAGNOSIS — H33321 Round hole, right eye: Secondary | ICD-10-CM | POA: Diagnosis not present

## 2021-09-26 DIAGNOSIS — H43811 Vitreous degeneration, right eye: Secondary | ICD-10-CM | POA: Diagnosis not present

## 2021-09-30 DIAGNOSIS — H33321 Round hole, right eye: Secondary | ICD-10-CM | POA: Diagnosis not present

## 2021-10-03 DIAGNOSIS — H02844 Edema of left upper eyelid: Secondary | ICD-10-CM | POA: Diagnosis not present

## 2021-10-03 DIAGNOSIS — H05012 Cellulitis of left orbit: Secondary | ICD-10-CM | POA: Diagnosis not present

## 2021-10-03 DIAGNOSIS — H0014 Chalazion left upper eyelid: Secondary | ICD-10-CM | POA: Diagnosis not present

## 2021-10-03 DIAGNOSIS — H11432 Conjunctival hyperemia, left eye: Secondary | ICD-10-CM | POA: Diagnosis not present

## 2021-10-10 DIAGNOSIS — H02844 Edema of left upper eyelid: Secondary | ICD-10-CM | POA: Diagnosis not present

## 2021-10-10 DIAGNOSIS — H05012 Cellulitis of left orbit: Secondary | ICD-10-CM | POA: Diagnosis not present

## 2021-10-20 DIAGNOSIS — R7309 Other abnormal glucose: Secondary | ICD-10-CM | POA: Diagnosis not present

## 2021-11-07 DIAGNOSIS — E890 Postprocedural hypothyroidism: Secondary | ICD-10-CM | POA: Diagnosis not present

## 2021-11-07 DIAGNOSIS — Z0000 Encounter for general adult medical examination without abnormal findings: Secondary | ICD-10-CM | POA: Diagnosis not present

## 2021-11-07 DIAGNOSIS — R7303 Prediabetes: Secondary | ICD-10-CM | POA: Diagnosis not present

## 2021-11-14 DIAGNOSIS — R509 Fever, unspecified: Secondary | ICD-10-CM | POA: Diagnosis not present

## 2021-11-14 DIAGNOSIS — Z0000 Encounter for general adult medical examination without abnormal findings: Secondary | ICD-10-CM | POA: Diagnosis not present

## 2021-11-14 DIAGNOSIS — G373 Acute transverse myelitis in demyelinating disease of central nervous system: Secondary | ICD-10-CM | POA: Diagnosis not present

## 2021-11-14 DIAGNOSIS — R000 Tachycardia, unspecified: Secondary | ICD-10-CM | POA: Diagnosis not present

## 2021-11-14 DIAGNOSIS — R079 Chest pain, unspecified: Secondary | ICD-10-CM | POA: Diagnosis not present

## 2021-11-14 DIAGNOSIS — M48061 Spinal stenosis, lumbar region without neurogenic claudication: Secondary | ICD-10-CM | POA: Diagnosis not present

## 2021-11-14 DIAGNOSIS — R208 Other disturbances of skin sensation: Secondary | ICD-10-CM | POA: Diagnosis not present

## 2021-11-15 ENCOUNTER — Observation Stay (HOSPITAL_BASED_OUTPATIENT_CLINIC_OR_DEPARTMENT_OTHER)
Admission: EM | Admit: 2021-11-15 | Discharge: 2021-11-16 | Disposition: A | Discharge status: Home / Self Care | Payer: BC Managed Care – PPO | Attending: Internal Medicine | Admitting: Internal Medicine

## 2021-11-15 ENCOUNTER — Encounter (HOSPITAL_BASED_OUTPATIENT_CLINIC_OR_DEPARTMENT_OTHER): Payer: Self-pay | Admitting: Emergency Medicine

## 2021-11-15 ENCOUNTER — Emergency Department (HOSPITAL_BASED_OUTPATIENT_CLINIC_OR_DEPARTMENT_OTHER): Payer: BC Managed Care – PPO

## 2021-11-15 ENCOUNTER — Other Ambulatory Visit: Payer: Self-pay

## 2021-11-15 ENCOUNTER — Emergency Department (HOSPITAL_BASED_OUTPATIENT_CLINIC_OR_DEPARTMENT_OTHER): Payer: BC Managed Care – PPO | Admitting: Radiology

## 2021-11-15 DIAGNOSIS — L03114 Cellulitis of left upper limb: Secondary | ICD-10-CM | POA: Insufficient documentation

## 2021-11-15 DIAGNOSIS — I2693 Single subsegmental pulmonary embolism without acute cor pulmonale: Principal | ICD-10-CM | POA: Insufficient documentation

## 2021-11-15 DIAGNOSIS — E039 Hypothyroidism, unspecified: Secondary | ICD-10-CM | POA: Insufficient documentation

## 2021-11-15 DIAGNOSIS — R000 Tachycardia, unspecified: Secondary | ICD-10-CM | POA: Diagnosis not present

## 2021-11-15 DIAGNOSIS — Z7982 Long term (current) use of aspirin: Secondary | ICD-10-CM | POA: Insufficient documentation

## 2021-11-15 DIAGNOSIS — J90 Pleural effusion, not elsewhere classified: Secondary | ICD-10-CM | POA: Diagnosis not present

## 2021-11-15 DIAGNOSIS — Z86718 Personal history of other venous thrombosis and embolism: Secondary | ICD-10-CM | POA: Insufficient documentation

## 2021-11-15 DIAGNOSIS — Z86711 Personal history of pulmonary embolism: Secondary | ICD-10-CM | POA: Insufficient documentation

## 2021-11-15 DIAGNOSIS — R079 Chest pain, unspecified: Secondary | ICD-10-CM | POA: Diagnosis not present

## 2021-11-15 DIAGNOSIS — L0390 Cellulitis, unspecified: Secondary | ICD-10-CM | POA: Diagnosis present

## 2021-11-15 DIAGNOSIS — Z79899 Other long term (current) drug therapy: Secondary | ICD-10-CM | POA: Insufficient documentation

## 2021-11-15 DIAGNOSIS — J9811 Atelectasis: Secondary | ICD-10-CM | POA: Diagnosis not present

## 2021-11-15 DIAGNOSIS — Z7901 Long term (current) use of anticoagulants: Secondary | ICD-10-CM | POA: Insufficient documentation

## 2021-11-15 DIAGNOSIS — R0781 Pleurodynia: Secondary | ICD-10-CM | POA: Diagnosis not present

## 2021-11-15 DIAGNOSIS — I2699 Other pulmonary embolism without acute cor pulmonale: Secondary | ICD-10-CM | POA: Diagnosis present

## 2021-11-15 LAB — BASIC METABOLIC PANEL
Anion gap: 11 (ref 5–15)
BUN: 13 mg/dL (ref 8–23)
CO2: 25 mmol/L (ref 22–32)
Calcium: 10 mg/dL (ref 8.9–10.3)
Chloride: 102 mmol/L (ref 98–111)
Creatinine, Ser: 0.73 mg/dL (ref 0.44–1.00)
GFR, Estimated: >60 mL/min (ref >60)
Glucose, Bld: 112 mg/dL — ABNORMAL HIGH (ref 70–99)
Potassium: 3.1 mmol/L — ABNORMAL LOW (ref 3.5–5.1)
Sodium: 138 mmol/L (ref 135–145)

## 2021-11-15 LAB — CBC
HCT: 37.3 % (ref 36.0–46.0)
Hemoglobin: 12.9 g/dL (ref 12.0–15.0)
MCH: 29.7 pg (ref 26.0–34.0)
MCHC: 34.6 g/dL (ref 30.0–36.0)
MCV: 85.9 fL (ref 80.0–100.0)
Platelets: 227 10*3/uL (ref 150–400)
RBC: 4.34 MIL/uL (ref 3.87–5.11)
RDW: 12.7 % (ref 11.5–15.5)
WBC: 11.5 10*3/uL — ABNORMAL HIGH (ref 4.0–10.5)
nRBC: 0 % (ref 0.0–0.2)

## 2021-11-15 LAB — PROTIME-INR
INR: 1.2 (ref 0.8–1.2)
Prothrombin Time: 14.7 seconds (ref 11.4–15.2)

## 2021-11-15 LAB — TROPONIN I (HIGH SENSITIVITY)
Troponin I (High Sensitivity): 3 ng/L (ref <18)
Troponin I (High Sensitivity): 3 ng/L (ref <18)

## 2021-11-15 MED ORDER — IOHEXOL 350 MG/ML SOLN
100.0000 mL | Freq: Once | INTRAVENOUS | Status: AC | PRN
  Administered 2021-11-15: 80 mL via INTRAVENOUS

## 2021-11-15 MED ORDER — HEPARIN BOLUS VIA INFUSION: 6000.0000 [IU] | Freq: Once | INTRAVENOUS | Status: DC

## 2021-11-15 MED ORDER — HEPARIN (PORCINE) 25000 UT/250ML-% IV SOLN: 1400.0000 [IU]/h | INTRAVENOUS | Status: DC

## 2021-11-15 MED ORDER — RIVAROXABAN 15 MG PO TABS
15.0000 mg | ORAL_TABLET | Freq: Once | ORAL | Status: AC
  Administered 2021-11-15: 15 mg via ORAL
  Filled 2021-11-15: qty 1

## 2021-11-15 NOTE — ED Triage Notes (Signed)
Pt diagnosed yesterday at PCP with pneumonia and started on antibiotics. Pt complains of sharp chest pain under her left breast that radiates to her left back. Pain is also accompanied by a deep breath. PCP advised her to come to Ed to be checked further.

## 2021-11-15 NOTE — Progress Notes (Signed)
  [facility] will assume care on arrival to accepting facility. Until arrival, care as per EDP. However, [facility] available 24/7 for questions and assistance.   Nursing staff please page [facility] Admits and Consults ([phone]) as soon as the patient arrives to the hospital.  [name], DO [facility]  

## 2021-11-15 NOTE — ED Provider Notes (Signed)
[facility] EMERGENCY DEPT Provider Note   CSN: [id number] Arrival date & time: 11/15/21  1823     History  Chief Complaint  Patient presents with   Chest Pain    Cynthia Villegas is a 61 y.o. female with history of Graves' disease and previous history of PE about 10 years ago presents to the emergency department for evaluation of pleuritic chest pain and shortness of breath in the setting of pneumonia diagnosed yesterday.  Patient states that she initially saw her primary care physician for cough and chest discomfort with x-ray identifying pneumonia of the left lower lobe.  She was given Rocephin in her primary care office yesterday and then given a prescription for doxycycline.  She picked that up this afternoon and has taken 1 dose.  What changed today is that she developed a sharp pain under her left breast that radiates deep into her back that is significantly worsened with deep inspiration.  She called her PCP who referred her to the ED for evaluation.   Patient denies unilateral leg swelling or leg pain, however she does note that about 1 week ago she had tenderness and some redness to her left upper arm.  She has been taking Advil and Aleve for this with improvement in discomfort.  Patient denies fevers, abdominal pain, nausea, vomiting and diarrhea.  Chest Pain      Home Medications Prior to Admission medications   Medication Sig Start Date End Date Taking? Authorizing Provider  aspirin EC 81 MG tablet Take 81 mg by mouth daily.    [provider]  ibuprofen (ADVIL,MOTRIN) 200 MG tablet Take 200 mg by mouth every 6 (six) hours as needed.    [provider]  levothyroxine (SYNTHROID, LEVOTHROID) 100 MCG tablet Take 100 mcg by mouth daily before breakfast.    [provider]      Allergies    Patient has no known allergies.    Review of Systems   Review of Systems  Cardiovascular:  Positive for chest pain.    Physical Exam Updated  Vital Signs BP (!) 154/89   Pulse 95   Temp 98.9 F (37.2 C) (Oral)   Resp 17   Ht 5\' 6"  (1.676 m)   Wt 93.9 kg   SpO2 98%   BMI 33.41 kg/m  Physical Exam Vitals and nursing note reviewed.  Constitutional:      General: She is not in acute distress.    Appearance: She is ill-appearing.  HENT:     Head: Atraumatic.  Eyes:     Conjunctiva/sclera: Conjunctivae normal.  Cardiovascular:     Rate and Rhythm: Regular rhythm. Tachycardia present.     Pulses: Normal pulses.          Radial pulses are 2+ on the right side and 2+ on the left side.       Dorsalis pedis pulses are 2+ on the right side and 2+ on the left side.     Heart sounds: No murmur heard. Pulmonary:     Effort: Pulmonary effort is normal. No respiratory distress.     Comments: Patient speaks in full and complete sentences although appears winded as she speaking Abdominal:     General: Abdomen is flat. There is no distension.     Palpations: Abdomen is soft.     Tenderness: There is no abdominal tenderness.  Musculoskeletal:        General: Normal range of motion.     Cervical back: Normal  range of motion.  Skin:    General: Skin is warm and dry.     Capillary Refill: Capillary refill takes less than 2 seconds.  Neurological:     General: No focal deficit present.     Mental Status: She is alert.  Psychiatric:        Mood and Affect: Mood normal.     ED Results / Procedures / Treatments   Labs (all labs ordered are listed, but only abnormal results are displayed) Labs Reviewed  BASIC METABOLIC PANEL - Abnormal; Notable for the following components:      Result Value   Potassium 3.1 (*)    Glucose, Bld 112 (*)    All other components within normal limits  CBC - Abnormal; Notable for the following components:   WBC 11.5 (*)    All other components within normal limits  PROTIME-INR  TROPONIN I (HIGH SENSITIVITY)  TROPONIN I (HIGH SENSITIVITY)    EKG None  Radiology CT Angio Chest PE W and/or Wo  Contrast  Result Date: 11/15/2021 CLINICAL DATA:  History of pneumonia chest pain under the left breast EXAM: CT ANGIOGRAPHY CHEST WITH CONTRAST TECHNIQUE: Multidetector CT imaging of the chest was performed using the standard protocol during bolus administration of intravenous contrast. Multiplanar CT image reconstructions and MIPs were obtained to evaluate the vascular anatomy. RADIATION DOSE REDUCTION: This exam was performed according to the departmental dose-optimization program which includes automated exposure control, adjustment of the mA and/or kV according to patient size and/or use of iterative reconstruction technique. CONTRAST:  61mL OMNIPAQUE IOHEXOL 350 MG/ML SOLN COMPARISON:  Chest x-ray 11/15/2021, CT chest 01/25/2018 FINDINGS: Cardiovascular: Satisfactory opacification of the pulmonary arteries to the segmental level. Small filling defect within lingular segmental vessel, series 5, image 101. No other discrete filling defects are seen. No evidence for right heart strain. Borderline cardiomegaly without pericardial effusion. Mediastinum/Nodes: Midline trachea. No thyroid mass. No suspicious lymph nodes. Esophagus within normal limits. Lungs/Pleura: Small left-sided pleural effusion. Scarring or atelectasis at the bases. Few benign-appearing calcifications in the posterior right lower lobe. Partial consolidation in the left lower lobe. Heterogeneous peripheral ground-glass density and consolidation at the lingula. Upper Abdomen: No acute finding. Musculoskeletal: No acute osseous abnormality Review of the MIP images confirms the above findings. IMPRESSION: 1. Positive for small acute pulmonary embolus involving segmental vessel at the lingula/left upper lobe. No evidence for right heart strain 2. Small left effusion with atelectasis or pneumonia at the left base. Heterogeneous peripheral ground-glass density and mild consolidation at the lingula may reflect additional pneumonia or small pulmonary  infarct. Critical Value/emergent results were called by telephone at the time of interpretation on 11/15/2021 at 8:29 pm to provider [facility] , who verbally acknowledged these results. Electronically Signed   By: Jasmine Pang M.D.   On: 11/15/2021 20:30   DG Chest 2 View  Result Date: 11/15/2021 CLINICAL DATA:  Chest pain EXAM: CHEST - 2 VIEW COMPARISON:  07/31/2012 FINDINGS: Bibasilar atelectasis. Heart is normal size. No effusions. No acute bony abnormality. IMPRESSION: Bibasilar atelectasis. Electronically Signed   By: Charlett Nose M.D.   On: 11/15/2021 19:09    Procedures Procedures    Medications Ordered in ED Medications  iohexol (OMNIPAQUE) 350 MG/ML injection 100 mL (80 mLs Intravenous Contrast Given 11/15/21 2007)  Rivaroxaban (XARELTO) tablet 15 mg (15 mg Oral Given 11/15/21 2138)    ED Course/ Medical Decision Making/ A&P  Medical Decision Making Amount and/or Complexity of Data Reviewed Labs: ordered. Radiology: ordered.  Risk Prescription drug management. Decision regarding hospitalization.   Social determinants of health:  Social History   Socioeconomic History   Marital status: Single    Spouse name: Not on file   Number of children: 1   Years of education: 12   Highest education level: Not on file  Occupational History   Not on file  Tobacco Use   Smoking status: Never   Smokeless tobacco: Never  Substance and Sexual Activity   Alcohol use: Yes    Comment: occasional   Drug use: No   Sexual activity: Not on file  Other Topics Concern   Not on file  Social History Narrative   Lives home alone.  Has one child.  Working from home now, but normally works in office setting.  Education HS grad.     Social Determinants of Health   Financial Resource Strain: Not on file  Food Insecurity: Not on file  Transportation Needs: Not on file  Physical Activity: Not on file  Stress: Not on file  Social Connections: Not on file   Intimate Partner Violence: Not on file     Initial impression:  This patient presents to the ED for concern of shortness of breath and pleuritic chest pain, this involves an extensive number of treatment options, and is a complaint that carries with it a high risk of complications and morbidity.   Differentials include pneumonia, PE, pericarditis, URI.   Comorbidities affecting care:  Previous history of DVT/PE, Graves' disease  Additional history obtained: Chart review  Lab Tests  I Ordered, reviewed, and interpreted labs and EKG.  The pertinent results include:  Leukocytosis 11.5 BMP unremarkable Troponin normal  Imaging Studies ordered:  I ordered imaging studies including  Chest x-ray bilateral atelectasis without focal consolidation CTA shows small subsegmental PE of the lingula/upper left lobe, possible small effusion/consolidation of the left lower lobe I independently visualized and interpreted imaging and I agree with the radiologist interpretation.   EKG: Sinus tachycardia with some ST depression  Cardiac Monitoring:  The patient was maintained on a cardiac monitor.  I personally viewed and interpreted the cardiac monitored which showed an underlying rhythm of: Sinus tachycardia   Medicines ordered and prescription drug management:  I ordered medication including: Xarelto 15 mg p.o. I have reviewed the patients home medicines and have made adjustments as needed  Consultations Obtained:  I requested consultation with hospitalist regarding admission and spoke with Dr. Imogene Burn,  and discussed lab and imaging findings as well as pertinent plan - they recommend: Dr. Imogene Burn personally reviewed patient's labs and imaging.  Since labs were overall without acute findings and patient is not hypoxic and is hemodynamically stable, he leaves that patient may be a good candidate for outpatient treatment for PE with Xarelto or Eliquis.  He recommends follow-up next week with PCP  and possible follow-up with heme-onc given that this was her second unprovoked PE in her lifetime.  However, he does note that if patient is uncomfortable with this plan then he would be willing to admit her for observation.   ED Course/Re-evaluation: 61 year old female presents emergency department for evaluation of pleuritic chest pain and shortness of breath.  Patient is tachycardic to 132 although she is afebrile and she is satting well on room air at 98%.  Patient is overall nontoxic-appearing although she does appear slightly winded when speaking.  On exam, lungs CTA bilaterally.  No  wheezes, crackles or rhonchi.  Patient's presentation is concerning for underlying pulmonary embolism so I ordered CTA which does confirm small segmental PE in the left lobe.  Labs were otherwise unremarkable.  After consultation with hospitalist, I had conversation with patient about outpatient management with follow-up with her PCP.  She notes that she would feel more comfortable with admission and observation.  I contacted Dr. Imogene Burn who agrees to admit patient and patient was given first dose of Xarelto 50 mg here in the emergency department.  Patient also took one of her home doses of doxycycline.  Disposition:  After consideration of the diagnostic results, physical exam, history and the patients response to treatment feel that the patent would benefit from admission/observation.   Pulmonary embolism: Plan and management as described above. Discharged home in good condition.  Final Clinical Impression(s) / ED Diagnoses Final diagnoses:  Single subsegmental pulmonary embolism without acute cor pulmonale [facility])    Rx / DC Orders ED Discharge Orders     None         Janell Quiet, PA-C 11/15/21 2200    Vanetta Mulders, MD 11/20/21 1527

## 2021-11-16 ENCOUNTER — Other Ambulatory Visit (HOSPITAL_COMMUNITY): Payer: Self-pay

## 2021-11-16 ENCOUNTER — Observation Stay (HOSPITAL_BASED_OUTPATIENT_CLINIC_OR_DEPARTMENT_OTHER): Payer: BC Managed Care – PPO

## 2021-11-16 DIAGNOSIS — Z7982 Long term (current) use of aspirin: Secondary | ICD-10-CM | POA: Diagnosis not present

## 2021-11-16 DIAGNOSIS — I2699 Other pulmonary embolism without acute cor pulmonale: Secondary | ICD-10-CM

## 2021-11-16 DIAGNOSIS — I2693 Single subsegmental pulmonary embolism without acute cor pulmonale: Secondary | ICD-10-CM

## 2021-11-16 DIAGNOSIS — L0390 Cellulitis, unspecified: Secondary | ICD-10-CM | POA: Diagnosis present

## 2021-11-16 DIAGNOSIS — Z86711 Personal history of pulmonary embolism: Secondary | ICD-10-CM | POA: Diagnosis not present

## 2021-11-16 DIAGNOSIS — E039 Hypothyroidism, unspecified: Secondary | ICD-10-CM | POA: Diagnosis not present

## 2021-11-16 DIAGNOSIS — Z79899 Other long term (current) drug therapy: Secondary | ICD-10-CM | POA: Diagnosis not present

## 2021-11-16 DIAGNOSIS — R0781 Pleurodynia: Secondary | ICD-10-CM | POA: Diagnosis not present

## 2021-11-16 DIAGNOSIS — R52 Pain, unspecified: Secondary | ICD-10-CM | POA: Diagnosis not present

## 2021-11-16 DIAGNOSIS — Z7901 Long term (current) use of anticoagulants: Secondary | ICD-10-CM | POA: Diagnosis not present

## 2021-11-16 DIAGNOSIS — L03114 Cellulitis of left upper limb: Secondary | ICD-10-CM | POA: Diagnosis not present

## 2021-11-16 DIAGNOSIS — Z86718 Personal history of other venous thrombosis and embolism: Secondary | ICD-10-CM | POA: Diagnosis not present

## 2021-11-16 LAB — BASIC METABOLIC PANEL
Anion gap: 14 (ref 5–15)
BUN: 8 mg/dL (ref 8–23)
CO2: 23 mmol/L (ref 22–32)
Calcium: 9.2 mg/dL (ref 8.9–10.3)
Chloride: 103 mmol/L (ref 98–111)
Creatinine, Ser: 0.69 mg/dL (ref 0.44–1.00)
GFR, Estimated: >60 mL/min (ref >60)
Glucose, Bld: 93 mg/dL (ref 70–99)
Potassium: 3.2 mmol/L — ABNORMAL LOW (ref 3.5–5.1)
Sodium: 140 mmol/L (ref 135–145)

## 2021-11-16 LAB — CBC
HCT: 40.3 % (ref 36.0–46.0)
Hemoglobin: 13.8 g/dL (ref 12.0–15.0)
MCH: 30.1 pg (ref 26.0–34.0)
MCHC: 34.2 g/dL (ref 30.0–36.0)
MCV: 87.8 fL (ref 80.0–100.0)
Platelets: 183 10*3/uL (ref 150–400)
RBC: 4.59 MIL/uL (ref 3.87–5.11)
RDW: 12.8 % (ref 11.5–15.5)
WBC: 10.2 10*3/uL (ref 4.0–10.5)
nRBC: 0 % (ref 0.0–0.2)

## 2021-11-16 LAB — HIV ANTIBODY (ROUTINE TESTING W REFLEX): HIV Screen 4th Generation wRfx: NONREACTIVE

## 2021-11-16 LAB — GLUCOSE, CAPILLARY: Glucose-Capillary: 109 mg/dL — ABNORMAL HIGH (ref 70–99)

## 2021-11-16 MED ORDER — ACETAMINOPHEN 650 MG RE SUPP: 650.0000 mg | Freq: Four times a day (QID) | RECTAL | Status: DC | PRN

## 2021-11-16 MED ORDER — RIVAROXABAN 20 MG PO TABS
20.0000 mg | ORAL_TABLET | Freq: Every day | ORAL | 1 refills | Status: DC
  Filled 2021-11-16: qty 30, 30d supply, fill #0

## 2021-11-16 MED ORDER — RIVAROXABAN (XARELTO) VTE STARTER PACK (15 & 20 MG)
ORAL_TABLET | ORAL | 0 refills | Status: DC
  Filled 2021-11-16: qty 51, 30d supply, fill #0

## 2021-11-16 MED ORDER — ONDANSETRON HCL 4 MG/2ML IJ SOLN: 4.0000 mg | Freq: Four times a day (QID) | INTRAMUSCULAR | Status: DC | PRN

## 2021-11-16 MED ORDER — OXYCODONE-ACETAMINOPHEN 5-325 MG PO TABS
1.0000 | ORAL_TABLET | ORAL | Status: DC | PRN
  Filled 2021-11-16: qty 2

## 2021-11-16 MED ORDER — ONDANSETRON HCL 4 MG PO TABS: 4.0000 mg | ORAL_TABLET | Freq: Four times a day (QID) | ORAL | Status: DC | PRN

## 2021-11-16 MED ORDER — POTASSIUM CHLORIDE CRYS ER 20 MEQ PO TBCR
40.0000 meq | EXTENDED_RELEASE_TABLET | Freq: Once | ORAL | Status: AC
  Administered 2021-11-16: 40 meq via ORAL
  Filled 2021-11-16: qty 2

## 2021-11-16 MED ORDER — DOXYCYCLINE HYCLATE 100 MG PO TABS
100.0000 mg | ORAL_TABLET | Freq: Two times a day (BID) | ORAL | Status: DC
  Administered 2021-11-16: 100 mg via ORAL
  Filled 2021-11-16: qty 1

## 2021-11-16 MED ORDER — RIVAROXABAN 20 MG PO TABS: 20.0000 mg | ORAL_TABLET | Freq: Every day | ORAL | Status: DC

## 2021-11-16 MED ORDER — RIVAROXABAN 15 MG PO TABS
15.0000 mg | ORAL_TABLET | Freq: Two times a day (BID) | ORAL | Status: DC
  Administered 2021-11-16: 15 mg via ORAL
  Filled 2021-11-16: qty 1

## 2021-11-16 MED ORDER — RIVAROXABAN 20 MG PO TABS
20.0000 mg | ORAL_TABLET | Freq: Every day | ORAL | 1 refills | Status: DC
  Filled 2021-11-16: qty 60, 60d supply, fill #0
  Filled 2021-11-16: qty 30, 30d supply, fill #0

## 2021-11-16 MED ORDER — ACETAMINOPHEN 325 MG PO TABS
650.0000 mg | ORAL_TABLET | Freq: Four times a day (QID) | ORAL | Status: DC | PRN
  Administered 2021-11-16: 650 mg via ORAL
  Filled 2021-11-16: qty 2

## 2021-11-16 NOTE — Progress Notes (Signed)
Patient complained of chest pain only with ambulation and deep breathing. Resting in bed with no complaint of pain. Denies pain with palpation. Instructed to call if pain worsens or if she has pain at rest.

## 2021-11-16 NOTE — Plan of Care (Signed)
  Problem: Education: Goal: Knowledge of General Education information will improve Description: Including pain rating scale, medication(s)/side effects and non-pharmacologic comfort measures 11/16/2021 1209 by Coy Saunas, RN Outcome: Adequate for Discharge 11/16/2021 1209 by Coy Saunas, RN Outcome: Adequate for Discharge   Problem: Health Behavior/Discharge Planning: Goal: Ability to manage health-related needs will improve 11/16/2021 1209 by Coy Saunas, RN Outcome: Adequate for Discharge 11/16/2021 1209 by Coy Saunas, RN Outcome: Adequate for Discharge   Problem: Clinical Measurements: Goal: Ability to maintain clinical measurements within normal limits will improve 11/16/2021 1209 by Coy Saunas, RN Outcome: Adequate for Discharge 11/16/2021 1209 by Coy Saunas, RN Outcome: Adequate for Discharge Goal: Will remain free from infection 11/16/2021 1209 by Coy Saunas, RN Outcome: Adequate for Discharge 11/16/2021 1209 by Coy Saunas, RN Outcome: Adequate for Discharge Goal: Diagnostic test results will improve 11/16/2021 1209 by Coy Saunas, RN Outcome: Adequate for Discharge 11/16/2021 1209 by Coy Saunas, RN Outcome: Adequate for Discharge Goal: Respiratory complications will improve 11/16/2021 1209 by Coy Saunas, RN Outcome: Adequate for Discharge 11/16/2021 1209 by Coy Saunas, RN Outcome: Adequate for Discharge Goal: Cardiovascular complication will be avoided 11/16/2021 1209 by Coy Saunas, RN Outcome: Adequate for Discharge 11/16/2021 1209 by Coy Saunas, RN Outcome: Adequate for Discharge   Problem: Activity: Goal: Risk for activity intolerance will decrease 11/16/2021 1209 by Coy Saunas, RN Outcome: Adequate for Discharge 11/16/2021 1209 by Coy Saunas, RN Outcome: Adequate for Discharge   Problem: Nutrition: Goal: Adequate nutrition will be maintained 11/16/2021 1209 by Coy Saunas, RN Outcome: Adequate for  Discharge 11/16/2021 1209 by Coy Saunas, RN Outcome: Adequate for Discharge   Problem: Coping: Goal: Level of anxiety will decrease 11/16/2021 1209 by Coy Saunas, RN Outcome: Adequate for Discharge 11/16/2021 1209 by Coy Saunas, RN Outcome: Adequate for Discharge   Problem: Elimination: Goal: Will not experience complications related to bowel motility 11/16/2021 1209 by Coy Saunas, RN Outcome: Adequate for Discharge 11/16/2021 1209 by Coy Saunas, RN Outcome: Adequate for Discharge Goal: Will not experience complications related to urinary retention 11/16/2021 1209 by Coy Saunas, RN Outcome: Adequate for Discharge 11/16/2021 1209 by Coy Saunas, RN Outcome: Adequate for Discharge   Problem: Pain Managment: Goal: General experience of comfort will improve 11/16/2021 1209 by Coy Saunas, RN Outcome: Adequate for Discharge 11/16/2021 1209 by Coy Saunas, RN Outcome: Adequate for Discharge   Problem: Safety: Goal: Ability to remain free from injury will improve 11/16/2021 1209 by Coy Saunas, RN Outcome: Adequate for Discharge 11/16/2021 1209 by Coy Saunas, RN Outcome: Adequate for Discharge   Problem: Skin Integrity: Goal: Risk for impaired skin integrity will decrease 11/16/2021 1209 by Coy Saunas, RN Outcome: Adequate for Discharge 11/16/2021 1209 by Coy Saunas, RN Outcome: Adequate for Discharge

## 2021-11-16 NOTE — Discharge Instructions (Signed)
Information on my medicine - XARELTO® (rivaroxaban) ° °WHY WAS XARELTO® PRESCRIBED FOR YOU? °Xarelto® was prescribed to treat blood clots that may have been found in the veins of your legs (deep vein thrombosis) or in your lungs (pulmonary embolism) and to reduce the risk of them occurring again. ° °What do you need to know about Xarelto®? °The starting dose is one 15 mg tablet taken TWICE daily with food for the FIRST 21 DAYS then the dose is changed to one 20 mg tablet taken ONCE A DAY with your evening meal. ° °DO NOT stop taking Xarelto® without talking to the health care provider who prescribed the medication.  Refill your prescription for 20 mg tablets before you run out. ° °After discharge, you should have regular check-up appointments with your healthcare provider that is prescribing your Xarelto®.  In the future your dose may need to be changed if your kidney function changes by a significant amount. ° °What do you do if you miss a dose? °If you are taking Xarelto® TWICE DAILY and you miss a dose, take it as soon as you remember. You may take two 15 mg tablets (total 30 mg) at the same time then resume your regularly scheduled 15 mg twice daily the next day. ° °If you are taking Xarelto® ONCE DAILY and you miss a dose, take it as soon as you remember on the same day then continue your regularly scheduled once daily regimen the next day. Do not take two doses of Xarelto® at the same time.  ° °Important Safety Information °Xarelto® is a blood thinner medicine that can cause bleeding. You should call your healthcare provider right away if you experience any of the following: °? Bleeding from an injury or your nose that does not stop. °? Unusual colored urine (red or dark brown) or unusual colored stools (red or black). °? Unusual bruising for unknown reasons. °? A serious fall or if you hit your head (even if there is no bleeding). ° °Some medicines may interact with Xarelto® and might increase your risk of  bleeding while on Xarelto®. To help avoid this, consult your healthcare provider or pharmacist prior to using any new prescription or non-prescription medications, including herbals, vitamins, non-steroidal anti-inflammatory drugs (NSAIDs) and supplements. ° °This website has more information on Xarelto®: www.xarelto.com. ° °

## 2021-11-16 NOTE — Progress Notes (Signed)
Left upper extremity venous duplex study completed. Please see CV Proc for preliminary results.  Nochum Fenter BS, RVT 11/16/2021 11:47 AM

## 2021-11-16 NOTE — Progress Notes (Signed)
Admit hospitalists paged re: pt arrival, pt arrived on unit via transport on stretcher, placed on bedside monitoring. 2 RN skin assessment completed w/ Dena RN

## 2021-11-16 NOTE — Discharge Summary (Signed)
Physician Discharge Summary  Cynthia Villegas QIH:[id number] DOB: 11/27/1960 DOA: 11/15/2021  PCP: [facility]  Admit date: 11/15/2021 Discharge date: 11/16/2021  Admitted From: Home Disposition: Home  Recommendations for Outpatient Follow-up:  Follow up with PCP in 1-2 weeks Continue uninterrupted blood thinners, will send referral to hematology for follow-up.  Home Health: N/A Equipment/Devices: N/A  Discharge Condition: Stable CODE STATUS: Full code Diet recommendation: Regular diet  Discharge summary: 61 year old female with history of DVT and PE in 2014 treated for Coumadin, sedentary and working from home all day and her computer recently seen at PCP office with cough and congestion and possible left lower lobe pneumonia with pleuritic chest pain developed worsening left-sided pleuritic pain radiating to the back so came to the ER.  In the emergency room she was on room air.  Tachycardic.  High suspicion of pulmonary embolism, CT scan was done that showed small acute pulmonary embolus involving segmental vessel at the lingula and left upper lobe, no right ventricular strain.  A small effusion with probable atelectasis on the left base.  Also suspected small pulmonary infarction.  Patient was observed in the hospital for any adverse events, started on Xarelto starter pack.  She had complained of left arm swelling, duplex study for both of the lower extremities and both of the upper extremities was negative for presence of any DVTs.  Second episode of spontaneous pulmonary embolism in a patient with sedentary lifestyle.  She will probably benefit with lifelong anticoagulation.  Agreeable to go on Xarelto.  Uninterrupted anticoagulation advised.  We will send a referral to hematology for follow-up, advised on anticoagulation, investigation of any other fractures if needed.  Patient with minimal symptoms, pleuritic pain control with Tylenol.  On room air.  Able to go home  with anticoagulation.  Discharge Diagnoses:  Principal Problem:   Pulmonary embolism [facility]) Active Problems:   Cellulitis   Hypothyroidism    Discharge Instructions  Discharge Instructions     Ambulatory referral to Hematology / Oncology   Complete by: As directed    Call MD for:  difficulty breathing, headache or visual disturbances   Complete by: As directed    Call MD for:  severe uncontrolled pain   Complete by: As directed    Diet general   Complete by: As directed    Increase activity slowly   Complete by: As directed       Allergies as of 11/16/2021   No Known Allergies      Medication List     STOP taking these medications    aspirin EC 81 MG tablet   naproxen sodium 220 MG tablet Commonly known as: ALEVE       TAKE these medications    Eye Vitamins Caps Take 1 capsule by mouth daily. Focus Select AREDS 2   HAIR/SKIN/NAILS PO Take 1 Dose by mouth daily. Vitafusion Hair, Skin and Nails gummy vitamin.   ibuprofen 200 MG tablet Commonly known as: ADVIL Take 400 mg by mouth daily as needed for mild pain.   levothyroxine 100 MCG tablet Commonly known as: SYNTHROID Take 100 mcg by mouth daily before breakfast.   Rivaroxaban Stater Pack (15 mg and 20 mg) Commonly known as: XARELTO STARTER PACK Follow package directions: Take one 15mg  tablet by mouth twice a day. On day 22, switch to one 20mg  tablet once a day. Take with food.   rivaroxaban 20 MG Tabs tablet Commonly known as: XARELTO Take 1 tablet (20 mg total) by mouth  daily with supper. Start taking on: November 18, 2021        No Known Allergies  Consultations: None   Procedures/Studies: CT Angio Chest PE W and/or Wo Contrast  Result Date: 11/15/2021 CLINICAL DATA:  History of pneumonia chest pain under the left breast EXAM: CT ANGIOGRAPHY CHEST WITH CONTRAST TECHNIQUE: Multidetector CT imaging of the chest was performed using the standard protocol during bolus administration of  intravenous contrast. Multiplanar CT image reconstructions and MIPs were obtained to evaluate the vascular anatomy. RADIATION DOSE REDUCTION: This exam was performed according to the departmental dose-optimization program which includes automated exposure control, adjustment of the mA and/or kV according to patient size and/or use of iterative reconstruction technique. CONTRAST:  63mL OMNIPAQUE IOHEXOL 350 MG/ML SOLN COMPARISON:  Chest x-ray 11/15/2021, CT chest 01/25/2018 FINDINGS: Cardiovascular: Satisfactory opacification of the pulmonary arteries to the segmental level. Small filling defect within lingular segmental vessel, series 5, image 101. No other discrete filling defects are seen. No evidence for right heart strain. Borderline cardiomegaly without pericardial effusion. Mediastinum/Nodes: Midline trachea. No thyroid mass. No suspicious lymph nodes. Esophagus within normal limits. Lungs/Pleura: Small left-sided pleural effusion. Scarring or atelectasis at the bases. Few benign-appearing calcifications in the posterior right lower lobe. Partial consolidation in the left lower lobe. Heterogeneous peripheral ground-glass density and consolidation at the lingula. Upper Abdomen: No acute finding. Musculoskeletal: No acute osseous abnormality Review of the MIP images confirms the above findings. IMPRESSION: 1. Positive for small acute pulmonary embolus involving segmental vessel at the lingula/left upper lobe. No evidence for right heart strain 2. Small left effusion with atelectasis or pneumonia at the left base. Heterogeneous peripheral ground-glass density and mild consolidation at the lingula may reflect additional pneumonia or small pulmonary infarct. Critical Value/emergent results were called by telephone at the time of interpretation on 11/15/2021 at 8:29 pm to provider [facility] , who verbally acknowledged these results. Electronically Signed   By: Donavan Foil M.D.   On: 11/15/2021 20:30   DG  Chest 2 View  Result Date: 11/15/2021 CLINICAL DATA:  Chest pain EXAM: CHEST - 2 VIEW COMPARISON:  07/31/2012 FINDINGS: Bibasilar atelectasis. Heart is normal size. No effusions. No acute bony abnormality. IMPRESSION: Bibasilar atelectasis. Electronically Signed   By: Rolm Baptise M.D.   On: 11/15/2021 19:09   (Echo, Carotid, EGD, Colonoscopy, ERCP)    Subjective: Patient seen and examined in the morning rounds.  She was getting duplexes of the extremities.  Patient complained of some left-sided pain however much better than last night.  Able to get around and walk without increased distress.  Comfortable going home and managing pain with Tylenol. Patient was just prescribed doxycycline for left arm swelling, negative for DVT.  She will complete therapy.  Pulmonary symptoms are likely from pulm infarction not from pneumonia. Potassium was replaced before discharge.  Discharge Exam: Vitals:   11/16/21 0700 11/16/21 0800  BP: 125/71 123/75  Pulse: 87 91  Resp: 20 (!) 22  Temp:    SpO2: 97% 97%   Vitals:   11/16/21 0500 11/16/21 0600 11/16/21 0700 11/16/21 0800  BP: (!) 142/83 127/76 125/71 123/75  Pulse: 92 92 87 91  Resp: (!) 21 (!) 23 20 (!) 22  Temp:      TempSrc:      SpO2: 97% 97% 97% 97%  Weight:      Height:        General: Pt is alert, awake, not in acute distress Cardiovascular: RRR, S1/S2 +, no  rubs, no gallops Respiratory: CTA bilaterally, no wheezing, no rhonchi Abdominal: Soft, NT, ND, bowel sounds + Extremities: no edema, no cyanosis    The results of significant diagnostics from this hospitalization (including imaging, microbiology, ancillary and laboratory) are listed below for reference.     Microbiology: No results found for this or any previous visit (from the past 240 hour(s)).   Labs: BNP (last 3 results) No results for input(s): "BNP" in the last 8760 hours. Basic Metabolic Panel: Recent Labs  Lab 11/15/21 1948 11/16/21 0422  NA 138 140  K  3.1* 3.2*  CL 102 103  CO2 25 23  GLUCOSE 112* 93  BUN 13 8  CREATININE 0.73 0.69  CALCIUM 10.0 9.2   Liver Function Tests: No results for input(s): "AST", "ALT", "ALKPHOS", "BILITOT", "PROT", "ALBUMIN" in the last 168 hours. No results for input(s): "LIPASE", "AMYLASE" in the last 168 hours. No results for input(s): "AMMONIA" in the last 168 hours. CBC: Recent Labs  Lab 11/15/21 1948 11/16/21 0422  WBC 11.5* 10.2  HGB 12.9 13.8  HCT 37.3 40.3  MCV 85.9 87.8  PLT 227 183   Cardiac Enzymes: No results for input(s): "CKTOTAL", "CKMB", "CKMBINDEX", "TROPONINI" in the last 168 hours. BNP: Invalid input(s): "POCBNP" CBG: Recent Labs  Lab 11/16/21 0740  GLUCAP 109*   D-Dimer No results for input(s): "DDIMER" in the last 72 hours. Hgb A1c No results for input(s): "HGBA1C" in the last 72 hours. Lipid Profile No results for input(s): "CHOL", "HDL", "LDLCALC", "TRIG", "CHOLHDL", "LDLDIRECT" in the last 72 hours. Thyroid function studies No results for input(s): "TSH", "T4TOTAL", "T3FREE", "THYROIDAB" in the last 72 hours.  Invalid input(s): "FREET3" Anemia work up No results for input(s): "VITAMINB12", "FOLATE", "FERRITIN", "TIBC", "IRON", "RETICCTPCT" in the last 72 hours. Urinalysis    Component Value Date/Time   COLORURINE YELLOW 12/09/2017 0057   APPEARANCEUR CLEAR 12/09/2017 0057   LABSPEC 1.014 12/09/2017 0057   PHURINE 7.0 12/09/2017 0057   GLUCOSEU NEGATIVE 12/09/2017 0057   HGBUR SMALL (A) 12/09/2017 0057   BILIRUBINUR NEGATIVE 12/09/2017 0057   KETONESUR NEGATIVE 12/09/2017 0057   PROTEINUR NEGATIVE 12/09/2017 0057   UROBILINOGEN 0.2 08/01/2012 1654   NITRITE NEGATIVE 12/09/2017 0057   LEUKOCYTESUR LARGE (A) 12/09/2017 0057   Sepsis Labs Recent Labs  Lab 11/15/21 1948 11/16/21 0422  WBC 11.5* 10.2   Microbiology No results found for this or any previous visit (from the past 240 hour(s)).   Time coordinating discharge: 35  minutes  SIGNED:   Dorcas Carrow, MD  [facility] 11/16/2021, 11:11 AM

## 2021-11-16 NOTE — Progress Notes (Signed)
Patient given discharge instructions and stated understanding. 

## 2021-11-16 NOTE — Assessment & Plan Note (Signed)
?   Cellulitis of LUE, DDx includes DVT source. Pt also had fever a couple of days ago: cellulitis vs DVT vs less likely PNA 1. Will leave on doxycycline that PCP ordered x2 days ago 2. Will check LUE [place] to see if DVT present

## 2021-11-16 NOTE — Assessment & Plan Note (Addendum)
Continue synthroid when med rec completed. 

## 2021-11-16 NOTE — H&P (Signed)
History and Physical    Patient: Cynthia Villegas LGX:[id number] DOB: November 24, 1960 DOA: 11/15/2021 DOS: the patient was seen and examined on 11/16/2021 [facility]CP: [facility]  Patient coming from: Home  Chief Complaint:  Chief Complaint  Patient presents with   Chest Pain   HPI: Cynthia Villegas is a 61 y.o. female with medical history significant of DVT/PE back in 2014.  Was on coumadin for a while but off of all anticoagulants for a number of years now.  Pt went to PCP office on Monday with c/o cough, chest discomfort, CXR reportedly showing LLL PNA.  Given rocephin in PCPs office yesterday and prescription for doxycycline.  Picked that up earlier in afternoon, took 1 dose.  Today however, developed sharp pain in L chest, radiates deep into back, significantly worse with deep breaths.  PCP sent her in to ED for eval.  On ROS: has pain erythema, swelling of LUE that PCP thought was cellulitis and put her on doxycycline for.  Has been taking advil and aleve for CP.  No subjective abd pain, N/V/D. Review of Systems: As mentioned in the history of present illness. All other systems reviewed and are negative. Past Medical History:  Diagnosis Date   Back pain    Graves disease    History of blood clots 2014   Past Surgical History:  Procedure Laterality Date   OTHER SURGICAL HISTORY     Hysterectomy   Social History:  reports that she has never smoked. She has never used smokeless tobacco. She reports current alcohol use. She reports that she does not use drugs.  No Known Allergies  Family History  Problem Relation Age of Onset   Breast cancer Mother    Dementia Mother    Heart disease Father     Prior to Admission medications   Medication Sig Start Date End Date Taking? Authorizing Provider  aspirin EC 81 MG tablet Take 81 mg by mouth daily.    [provider]  ibuprofen (ADVIL,MOTRIN) 200 MG tablet Take 200 mg by mouth every 6 (six) hours as needed.     [provider]  levothyroxine (SYNTHROID, LEVOTHROID) 100 MCG tablet Take 100 mcg by mouth daily before breakfast.    [provider]    Physical Exam: Vitals:   11/16/21 0000 11/16/21 0100 11/16/21 0230 11/16/21 0300  BP: 139/78 137/77 (!) 154/86 (!) 148/77  Pulse: 85 82 95 87  Resp: 19 19 20  (!) 21  Temp:    98.1 F (36.7 C)  TempSrc:    Oral  SpO2: 97% 96% 98% 97%  Weight:      Height:       Constitutional: NAD, calm, comfortable Eyes: PERRL, lids and conjunctivae normal ENMT: Mucous membranes are moist. Posterior pharynx clear of any exudate or lesions.Normal dentition.  Neck: normal, supple, no masses, no thyromegaly Respiratory: clear to auscultation bilaterally, no wheezing, no crackles. Normal respiratory effort. No accessory muscle use.  Cardiovascular: Regular rate and rhythm, no murmurs / rubs / gallops. No extremity edema. 2+ pedal pulses. No carotid bruits.  Abdomen: no tenderness, no masses palpated. No hepatosplenomegaly. Bowel sounds positive.  Musculoskeletal: no clubbing / cyanosis. No joint deformity upper and lower extremities. Good ROM, no contractures. Normal muscle tone.  Skin: no rashes, lesions, ulcers. No induration Neurologic: CN 2-12 grossly intact. Sensation intact, DTR normal. Strength 5/5 in all 4.  Psychiatric: Normal judgment and insight. Alert and oriented x 3. Normal mood.   Data Reviewed:  IMPRESSION: 1. Positive for small acute pulmonary embolus involving segmental vessel at the lingula/left upper lobe. No evidence for right heart strain 2. Small left effusion with atelectasis or pneumonia at the left base. Heterogeneous peripheral ground-glass density and mild consolidation at the lingula may reflect additional pneumonia or small pulmonary infarct.  Assessment and Plan: * Pulmonary embolism (HCC) Small lingular PE Small amount of what is probably pulmonary infarct. Xarelto started - likely needs life-long  anticoagulation given that this is 2nd PE Will get [place] of LE for DVT, as well as [place] LUE for DVT given h/o pain and swelling there, but intervention (beyond anticoagulation) for non-occlusive non symptomatic DVT seems unlikely (unlikely to change management) 2d echo felt un-needed.  Very unlikely that the segmental PE she has will be causing RHS. Tele monitor for tonight Percocet PRN chest pain  Cellulitis ? Cellulitis of LUE, DDx includes DVT source. Pt also had fever a couple of days ago: cellulitis vs DVT vs less likely PNA Will leave on doxycycline that PCP ordered x2 days ago Will check LUE [place] to see if DVT present  Hypothyroidism Continue synthroid when med rec completed.      Advance Care Planning:   Code Status: Full Code  Consults: None  Family Communication: No family in room  Severity of Illness: The appropriate patient status for this patient is OBSERVATION. Observation status is judged to be reasonable and necessary in order to provide the required intensity of service to ensure the patient's safety. The patient's presenting symptoms, physical exam findings, and initial radiographic and laboratory data in the context of their medical condition is felt to place them at decreased risk for further clinical deterioration. Furthermore, it is anticipated that the patient will be medically stable for discharge from the hospital within 2 midnights of admission.   Author: Hillary Bow., DO 11/16/2021 3:43 AM  For on call review [email or web address].

## 2021-11-16 NOTE — TOC Benefit Eligibility Note (Signed)
Patient Product/process development scientist completed.    The patient is currently admitted and upon discharge could be taking Eliquis 5 mg.  The current 30 day co-pay is, $30.00.   The patient is currently admitted and upon discharge could be taking Xarelto 20 mg.  The current 30 day co-pay is, $30.00.   The patient is insured through [place] [facility]     Roland Earl, CPhT Pharmacy Patient Advocate Specialist [facility] Pharm[facility] Patient Advocate Team Direct Number: [phone]  Fax: [phone]

## 2021-11-16 NOTE — TOC Transition Note (Signed)
Transition of Care [facility]) - CM/SW Discharge Note   Patient Details  Name: Cynthia Villegas MRN: [id number] Date of Birth: Nov 18, 1960  [facility] CM/SW Contact:  Kermit Balo, RN Phone Number: 11/16/2021, 12:08 PM   Clinical Narrative:    Pt is discharging home with self care. Pt new to Xarelto and [facility] pharmacy will provide her with the starter pack that is under the 30 day free coupon.  CM provided her the $10 co pays card for after the initial 30 days.  Pt states she has transportation home.   Final next level of care: Home/Self Care Barriers to Discharge: No Barriers Identified   Patient Goals and CMS Choice        Discharge Placement                       Discharge Plan and Services                                     Social Determinants of Health (SDOH) Interventions     Readmission Risk Interventions     No data to display

## 2021-11-16 NOTE — Assessment & Plan Note (Addendum)
1. Small lingular PE 1. Small amount of what is probably pulmonary infarct. 2. Xarelto started - likely needs life-long anticoagulation given that this is 2nd PE 3. Will get [place] of LE for DVT, as well as [place] LUE for DVT given h/o pain and swelling there, but intervention (beyond anticoagulation) for non-occlusive non symptomatic DVT seems unlikely (unlikely to change management) 4. 2d echo felt un-needed.  Very unlikely that the segmental PE she has will be causing RHS. 5. Tele monitor for tonight 6. Percocet PRN chest pain

## 2021-11-16 NOTE — Progress Notes (Signed)
Bilateral lower extremity venous duplex study completed. Please see CV Proc for preliminary results.  Lavell Ridings BS, RVT 11/16/2021 11:46 AM

## 2021-11-16 NOTE — ED Notes (Signed)
Report given to [facility] 

## 2021-11-19 DIAGNOSIS — R7309 Other abnormal glucose: Secondary | ICD-10-CM | POA: Diagnosis not present

## 2021-11-21 ENCOUNTER — Telehealth: Payer: Self-pay | Admitting: Physician Assistant

## 2021-11-21 NOTE — Telephone Encounter (Signed)
Scheduled appt per 6/28 referral. Pt is aware of appt date and time. Pt is aware to arrive 15 mins prior to appt time and to bring and updated insurance card. Pt is aware of appt location.   

## 2021-11-25 DIAGNOSIS — Z86711 Personal history of pulmonary embolism: Secondary | ICD-10-CM | POA: Diagnosis not present

## 2021-11-25 DIAGNOSIS — Z09 Encounter for follow-up examination after completed treatment for conditions other than malignant neoplasm: Secondary | ICD-10-CM | POA: Diagnosis not present

## 2021-11-25 DIAGNOSIS — M7989 Other specified soft tissue disorders: Secondary | ICD-10-CM | POA: Diagnosis not present

## 2021-12-01 ENCOUNTER — Telehealth (HOSPITAL_BASED_OUTPATIENT_CLINIC_OR_DEPARTMENT_OTHER): Payer: Self-pay

## 2021-12-01 ENCOUNTER — Other Ambulatory Visit (HOSPITAL_BASED_OUTPATIENT_CLINIC_OR_DEPARTMENT_OTHER): Payer: Self-pay

## 2021-12-01 NOTE — Telephone Encounter (Signed)
Transitions of Care Pharmacy   Call attempted for a pharmacy transitions of care follow-up. HIPAA appropriate voicemail was left with call back information provided.   Call attempt #1. Will follow-up in 2-3 days.    Jiles Crocker, PharmD Clinical Pharmacist Med [facility] 12/01/2021 10:18 AM

## 2021-12-13 ENCOUNTER — Other Ambulatory Visit: Payer: Self-pay

## 2021-12-13 ENCOUNTER — Inpatient Hospital Stay: Payer: BC Managed Care – PPO | Attending: Physician Assistant | Admitting: Physician Assistant

## 2021-12-13 ENCOUNTER — Inpatient Hospital Stay: Payer: BC Managed Care – PPO

## 2021-12-13 VITALS — BP 140/89 | HR 64 | Temp 98.2°F | Resp 17 | Ht 66.0 in | Wt 209.8 lb

## 2021-12-13 DIAGNOSIS — R079 Chest pain, unspecified: Secondary | ICD-10-CM | POA: Diagnosis not present

## 2021-12-13 DIAGNOSIS — Z7901 Long term (current) use of anticoagulants: Secondary | ICD-10-CM | POA: Insufficient documentation

## 2021-12-13 DIAGNOSIS — I2699 Other pulmonary embolism without acute cor pulmonale: Secondary | ICD-10-CM | POA: Insufficient documentation

## 2021-12-13 DIAGNOSIS — Z79890 Hormone replacement therapy (postmenopausal): Secondary | ICD-10-CM | POA: Diagnosis not present

## 2021-12-13 DIAGNOSIS — Z803 Family history of malignant neoplasm of breast: Secondary | ICD-10-CM | POA: Insufficient documentation

## 2021-12-13 LAB — CBC WITH DIFFERENTIAL (CANCER CENTER ONLY)
Abs Immature Granulocytes: 0.01 10*3/uL (ref 0.00–0.07)
Basophils Absolute: 0 10*3/uL (ref 0.0–0.1)
Basophils Relative: 0 %
Eosinophils Absolute: 0.1 10*3/uL (ref 0.0–0.5)
Eosinophils Relative: 2 %
HCT: 37.5 % (ref 36.0–46.0)
Hemoglobin: 12.9 g/dL (ref 12.0–15.0)
Immature Granulocytes: 0 %
Lymphocytes Relative: 40 %
Lymphs Abs: 2.2 10*3/uL (ref 0.7–4.0)
MCH: 29.7 pg (ref 26.0–34.0)
MCHC: 34.4 g/dL (ref 30.0–36.0)
MCV: 86.4 fL (ref 80.0–100.0)
Monocytes Absolute: 0.6 10*3/uL (ref 0.1–1.0)
Monocytes Relative: 12 %
Neutro Abs: 2.5 10*3/uL (ref 1.7–7.7)
Neutrophils Relative %: 46 %
Platelet Count: 239 10*3/uL (ref 150–400)
RBC: 4.34 MIL/uL (ref 3.87–5.11)
RDW: 13.1 % (ref 11.5–15.5)
WBC Count: 5.4 10*3/uL (ref 4.0–10.5)
nRBC: 0 % (ref 0.0–0.2)

## 2021-12-13 LAB — CMP (CANCER CENTER ONLY)
ALT: 23 U/L (ref 0–44)
AST: 21 U/L (ref 15–41)
Albumin: 4.1 g/dL (ref 3.5–5.0)
Alkaline Phosphatase: 69 U/L (ref 38–126)
Anion gap: 6 (ref 5–15)
BUN: 9 mg/dL (ref 8–23)
CO2: 31 mmol/L (ref 22–32)
Calcium: 9.4 mg/dL (ref 8.9–10.3)
Chloride: 105 mmol/L (ref 98–111)
Creatinine: 0.72 mg/dL (ref 0.44–1.00)
GFR, Estimated: >60 mL/min (ref >60)
Glucose, Bld: 63 mg/dL — ABNORMAL LOW (ref 70–99)
Potassium: 3.4 mmol/L — ABNORMAL LOW (ref 3.5–5.1)
Sodium: 142 mmol/L (ref 135–145)
Total Bilirubin: 0.4 mg/dL (ref 0.3–1.2)
Total Protein: 7.5 g/dL (ref 6.5–8.1)

## 2021-12-14 NOTE — Progress Notes (Signed)
[facility] [phone]   [phone]  INITIAL CONSULT NOTE  Patient Care Team: [facility], [facility] as PCP - General (Rheumatology)  Hematological/Oncological History 1) 07/31/2012: Presented with acute chest pain. CT scan showed acute pulmonary embolism within the right lower lobe segmental branch.  She was discharged home on warfarin.  Underwent hypercoagulable work-up that was unremarkable.  2) 09/11/2012: Establish care with hematology/oncology with Dr. Clelia Croft on 09/11/2012.  Felt that PE was unprovoked and recommended duration of anticoagulation for 6 months and then addition to low-dose aspirin 81 mg.  3) 11/15/2021-11/16/2021: Presented with cough, congestion and left pleuritic chest pain.  CT angiogram showed small acute pulmonary embolus involving the segmental vessels at the lingula and left upper lobe.  There was no evidence of DVTs.  Patient was discharged on Xarelto.  CHIEF COMPLAINTS/PURPOSE OF CONSULTATION:  Recurrent pulmonary emboli  HISTORY OF PRESENTING ILLNESS:  Cynthia Villegas 61 y.o. female with medical history significant for Graves' disease presents to the hematology clinic for recurrent v pulmonary emboli.  She is unaccompanied for this visit.  On exam today, Cynthia Villegas reports that since she left the hospital she noticed her pleuritic chest pain resolved within a week.  She continues to take Xarelto with good tolerance.  She denies any bleeding issues.  Her appetite and energy levels are back to normal.  She denies any GI symptoms including nausea, vomiting or abdominal pain.  Her bowel habits are unchanged without any recurrent episodes of diarrhea or constipation.  He denies fevers, chills, night sweats, shortness of breath, chest pain or cough.  She has no other complaints.  Rest of the 10 point ROS is below.  MEDICAL HISTORY:  Past Medical History:  Diagnosis Date   Back pain    Graves disease    History of blood  clots 2014    SURGICAL HISTORY: Past Surgical History:  Procedure Laterality Date   OTHER SURGICAL HISTORY     Hysterectomy    SOCIAL HISTORY: Social History   Socioeconomic History   Marital status: Single    Spouse name: Not on file   Number of children: 1   Years of education: 12   Highest education level: Not on file  Occupational History   Not on file  Tobacco Use   Smoking status: Never   Smokeless tobacco: Never  Substance and Sexual Activity   Alcohol use: Yes    Comment: occasional   Drug use: No   Sexual activity: Not on file  Other Topics Concern   Not on file  Social History Narrative   Lives home alone.  Has one child.  Working from home now, but normally works in office setting.  Education HS grad.     Social Determinants of Health   Financial Resource Strain: Not on file  Food Insecurity: Not on file  Transportation Needs: Not on file  Physical Activity: Not on file  Stress: Not on file  Social Connections: Not on file  Intimate Partner Violence: Not on file    FAMILY HISTORY: Family History  Problem Relation Age of Onset   Breast cancer Mother    Dementia Mother    Heart disease Father     ALLERGIES:  has No Known Allergies.  MEDICATIONS:  Current Outpatient Medications  Medication Sig Dispense Refill   acetaminophen (TYLENOL) 500 MG tablet Take 1,000 mg by mouth every 8 (eight) hours as needed.     Biotin w/ Vitamins C & E (HAIR/SKIN/NAILS PO)  Take 1 Dose by mouth daily. Vitafusion Hair, Skin and Nails gummy vitamin.     levothyroxine (SYNTHROID, LEVOTHROID) 100 MCG tablet Take 100 mcg by mouth daily before breakfast.     Multiple Vitamins-Minerals (EYE VITAMINS) CAPS Take 1 capsule by mouth daily. Focus Select AREDS 2     [START ON 12/15/2021] rivaroxaban (XARELTO) 20 MG TABS tablet *Start after finishing Xarelto Starter Pack*. Take 1 tablet (20 mg total) by mouth daily with supper. 30 tablet 1   RIVAROXABAN (XARELTO) VTE STARTER PACK  (15 & 20 MG) Follow package directions: Take one 15mg  tablet by mouth twice a day. On day 22, switch to one 20mg  tablet once a day. Take with food. 51 each 0   ibuprofen (ADVIL,MOTRIN) 200 MG tablet Take 400 mg by mouth daily as needed for mild pain. (Patient not taking: Reported on 12/13/2021)     No current facility-administered medications for this visit.    REVIEW OF SYSTEMS:   Constitutional: ( - ) fevers, ( - )  chills , ( - ) night sweats Eyes: ( - ) blurriness of vision, ( - ) double vision, ( - ) watery eyes Ears, nose, mouth, throat, and face: ( - ) mucositis, ( - ) sore throat Respiratory: ( - ) cough, ( - ) dyspnea, ( - ) wheezes Cardiovascular: ( - ) palpitation, ( - ) chest discomfort, ( - ) lower extremity swelling Gastrointestinal:  ( - ) nausea, ( - ) heartburn, ( - ) change in bowel habits Skin: ( - ) abnormal skin rashes Lymphatics: ( - ) new lymphadenopathy, ( - ) easy bruising Neurological: ( - ) numbness, ( - ) tingling, ( - ) new weaknesses Behavioral/Psych: ( - ) mood change, ( - ) new changes  All other systems were reviewed with the patient and are negative.  PHYSICAL EXAMINATION: ECOG PERFORMANCE STATUS: 0 - Asymptomatic  Vitals:   12/13/21 1400  BP: 140/89  Pulse: 64  Resp: 17  Temp: 98.2 F (36.8 C)  SpO2: 100%   Filed Weights   12/13/21 1400  Weight: 209 lb 12.8 oz (95.2 kg)    GENERAL: well appearing female in NAD  SKIN: skin color, texture, turgor are normal, no rashes or significant lesions EYES: conjunctiva are pink and non-injected, sclera clear OROPHARYNX: no exudate, no erythema; lips, buccal mucosa, and tongue normal  NECK: supple, non-tender LYMPH:  no palpable lymphadenopathy in the cervical or supraclavicular lymph nodes.  LUNGS: clear to auscultation and percussion with normal breathing effort HEART: regular rate & rhythm and no murmurs and no lower extremity edema ABDOMEN: soft, non-tender, non-distended, normal bowel  sounds Musculoskeletal: no cyanosis of digits and no clubbing  PSYCH: alert & oriented x 3, fluent speech NEURO: no focal motor/sensory deficits  LABORATORY DATA:  I have reviewed the data as listed    Latest Ref Rng & Units 12/13/2021    2:54 PM 11/16/2021    4:22 AM 11/15/2021    7:48 PM  CBC  WBC 4.0 - 10.5 K/uL 5.4  10.2  11.5   Hemoglobin 12.0 - 15.0 g/dL 11/18/2021  11/17/2021  08.6   Hematocrit 36.0 - 46.0 % 37.5  40.3  37.3   Platelets 150 - 400 K/uL 239  183  227        Latest Ref Rng & Units 12/13/2021    2:54 PM 11/16/2021    4:22 AM 11/15/2021    7:48 PM  CMP  Glucose 70 - 99 mg/dL  63  93  112   BUN 8 - 23 mg/dL 9  8  13    Creatinine 0.44 - 1.00 mg/dL  3.08  6.57   Sodium 135 - 145 mmol/L 142  140  138   Potassium 3.5 - 5.1 mmol/L 3.4  3.2  3.1   Chloride 98 - 111 mmol/L 105  103  102   CO2 22 - 32 mmol/L 31  23  25    Calcium 8.9 - 10.3 mg/dL 9.4  9.2  8.46   Total Protein 6.5 - 8.1 g/dL 7.5     Total Bilirubin 0.3 - 1.2 mg/dL 0.4     Alkaline Phos 38 - 126 U/L 69     AST 15 - 41 U/L 21     ALT 0 - 44 U/L 23      RADIOGRAPHIC STUDIES: I have personally reviewed the radiological images as listed and agreed with the findings in the report. VAS UPPER EXTREMITY VENOUS DUPLEX  Result Date: 11/16/2021 UPPER VENOUS STUDY  Patient Name:  Cynthia Villegas  Date of Exam:   11/16/2021 Medical Rec #: Cynthia Villegas         Accession #:    11/18/2021 Date of Birth: 1960-09-29         Patient Gender: F Patient Age:   36 years Exam Location:  [facility] Procedure:      VAS 68 UPPER EXTREMITY VENOUS DUPLEX Referring Phys: [facility] --------------------------------------------------------------------------------  Indications: Pain, and pulmonary embolism Comparison Study: No previous exam noted. Performing Technologist: [place] BS, RVT  Examination Guidelines: A complete evaluation includes B-mode imaging, spectral Doppler, color Doppler, and power Doppler as needed of all  accessible portions of each vessel. Bilateral testing is considered an integral part of a complete examination. Limited examinations for reoccurring indications may be performed as noted.  Right Findings: +----------+------------+---------+-----------+----------+-------+ RIGHT     CompressiblePhasicitySpontaneousPropertiesSummary +----------+------------+---------+-----------+----------+-------+ Subclavian    Full       Yes       Yes                      +----------+------------+---------+-----------+----------+-------+  Left Findings: +----------+------------+---------+-----------+----------+-------+ LEFT      CompressiblePhasicitySpontaneousPropertiesSummary +----------+------------+---------+-----------+----------+-------+ IJV           Full       Yes       Yes                      +----------+------------+---------+-----------+----------+-------+ Subclavian    Full       Yes       Yes                      +----------+------------+---------+-----------+----------+-------+ Axillary      Full       Yes       Yes                      +----------+------------+---------+-----------+----------+-------+ Brachial      Full                                          +----------+------------+---------+-----------+----------+-------+ Radial        Full                                          +----------+------------+---------+-----------+----------+-------+  Ulnar         Full                                          +----------+------------+---------+-----------+----------+-------+ Cephalic      Full                                          +----------+------------+---------+-----------+----------+-------+ Basilic       Full                                          +----------+------------+---------+-----------+----------+-------+  Summary:  Right: No evidence of thrombosis in the subclavian.  Left: No evidence of deep vein thrombosis in the upper  extremity. No evidence of superficial vein thrombosis in the upper extremity.  *See table(s) above for measurements and observations.  Diagnosing physician: Gerarda Fraction Electronically signed by Gerarda Fraction on 11/16/2021 at 5:29:43 PM.    Final    VAS [place] LOWER EXTREMITY VENOUS (DVT)  Result Date: 11/16/2021  Lower Venous DVT Study Patient Name:  Cynthia Villegas  Date of Exam:   11/16/2021 Medical Rec #: [id number]         Accession #:    [id number] Date of Birth: 1961-05-02         Patient Gender: F Patient Age:   18 years Exam Location:  [facility] Procedure:      VAS [place] LOWER EXTREMITY VENOUS (DVT) Referring Phys: Lyda Perone --------------------------------------------------------------------------------  Indications: Pulmonary embolism.  Comparison Study: No previous exam noted. Performing Technologist: Magdalene River BS, RVT  Examination Guidelines: A complete evaluation includes B-mode imaging, spectral Doppler, color Doppler, and power Doppler as needed of all accessible portions of each vessel. Bilateral testing is considered an integral part of a complete examination. Limited examinations for reoccurring indications may be performed as noted. The reflux portion of the exam is performed with the patient in reverse Trendelenburg.  +---------+---------------+---------+-----------+----------+--------------+ RIGHT    CompressibilityPhasicitySpontaneityPropertiesThrombus Aging +---------+---------------+---------+-----------+----------+--------------+ CFV      Full           Yes      Yes                                 +---------+---------------+---------+-----------+----------+--------------+ SFJ      Full                                                        +---------+---------------+---------+-----------+----------+--------------+ FV Prox  Full                                                         +---------+---------------+---------+-----------+----------+--------------+ FV Mid   Full                                                        +---------+---------------+---------+-----------+----------+--------------+  FV DistalFull                                                        +---------+---------------+---------+-----------+----------+--------------+ PFV      Full                                                        +---------+---------------+---------+-----------+----------+--------------+ POP      Full           Yes      Yes                                 +---------+---------------+---------+-----------+----------+--------------+ PTV      Full                                                        +---------+---------------+---------+-----------+----------+--------------+ PERO     Full                                                        +---------+---------------+---------+-----------+----------+--------------+   +---------+---------------+---------+-----------+----------+--------------+ LEFT     CompressibilityPhasicitySpontaneityPropertiesThrombus Aging +---------+---------------+---------+-----------+----------+--------------+ CFV      Full           Yes      Yes                                 +---------+---------------+---------+-----------+----------+--------------+ SFJ      Full                                                        +---------+---------------+---------+-----------+----------+--------------+ FV Prox  Full                                                        +---------+---------------+---------+-----------+----------+--------------+ FV Mid   Full                                                        +---------+---------------+---------+-----------+----------+--------------+ FV DistalFull                                                         +---------+---------------+---------+-----------+----------+--------------+  PFV      Full                                                        +---------+---------------+---------+-----------+----------+--------------+ POP      Full           Yes      Yes                                 +---------+---------------+---------+-----------+----------+--------------+ PTV      Full                                                        +---------+---------------+---------+-----------+----------+--------------+ PERO     Full                                                        +---------+---------------+---------+-----------+----------+--------------+     Summary: BILATERAL: - No evidence of deep vein thrombosis seen in the lower extremities, bilaterally. -No evidence of popliteal cyst, bilaterally.   *See table(s) above for measurements and observations. Electronically signed by Gerarda Fraction on 11/16/2021 at 5:29:09 PM.    Final    CT Angio Chest PE W and/or Wo Contrast  Result Date: 11/15/2021 CLINICAL DATA:  History of pneumonia chest pain under the left breast EXAM: CT ANGIOGRAPHY CHEST WITH CONTRAST TECHNIQUE: Multidetector CT imaging of the chest was performed using the standard protocol during bolus administration of intravenous contrast. Multiplanar CT image reconstructions and MIPs were obtained to evaluate the vascular anatomy. RADIATION DOSE REDUCTION: This exam was performed according to the departmental dose-optimization program which includes automated exposure control, adjustment of the mA and/or kV according to patient size and/or use of iterative reconstruction technique. CONTRAST:  36mL OMNIPAQUE IOHEXOL 350 MG/ML SOLN COMPARISON:  Chest x-ray 11/15/2021, CT chest 01/25/2018 FINDINGS: Cardiovascular: Satisfactory opacification of the pulmonary arteries to the segmental level. Small filling defect within lingular segmental vessel, series 5, image 101. No other discrete  filling defects are seen. No evidence for right heart strain. Borderline cardiomegaly without pericardial effusion. Mediastinum/Nodes: Midline trachea. No thyroid mass. No suspicious lymph nodes. Esophagus within normal limits. Lungs/Pleura: Small left-sided pleural effusion. Scarring or atelectasis at the bases. Few benign-appearing calcifications in the posterior right lower lobe. Partial consolidation in the left lower lobe. Heterogeneous peripheral ground-glass density and consolidation at the lingula. Upper Abdomen: No acute finding. Musculoskeletal: No acute osseous abnormality Review of the MIP images confirms the above findings. IMPRESSION: 1. Positive for small acute pulmonary embolus involving segmental vessel at the lingula/left upper lobe. No evidence for right heart strain 2. Small left effusion with atelectasis or pneumonia at the left base. Heterogeneous peripheral ground-glass density and mild consolidation at the lingula may reflect additional pneumonia or small pulmonary infarct. Critical Value/emergent results were called by telephone at the time of interpretation on 11/15/2021 at 8:29 pm to provider [facility] , who verbally acknowledged these results. Electronically Signed  By: Jasmine PangKim  Fujinaga M.D.   On: 11/15/2021 20:30   DG Chest 2 View  Result Date: 11/15/2021 CLINICAL DATA:  Chest pain EXAM: CHEST - 2 VIEW COMPARISON:  07/31/2012 FINDINGS: Bibasilar atelectasis. Heart is normal size. No effusions. No acute bony abnormality. IMPRESSION: Bibasilar atelectasis. Electronically Signed   By: Charlett NoseKevin  Dover M.D.   On: 11/15/2021 19:09    ASSESSMENT & PLAN Cynthia JensenJanice R Villegas is a 61 y.o.  female who presents to the clinic to establish care for recurrent pulmonary emboli.  Her first episode was in 2014 and she was treated with Coumadin for 6 months and then transition to baby aspirin.  Her most recent episode was in June 2023 and was discharged on Xarelto therapy which she continues today.  She  is tolerating Xarelto therapy without any prohibitive toxicities. We reviewed differences between provoked and unprovoked venous thromboembolisms.   A provoked venous thromboembolism (VTE) is one that has a clear inciting factor or event. Provoking factors include prolonged travel/immobility, surgery (particular abdominal or orthropedic), trauma,  and pregnancy/ estrogen containing birth control. After a detailed history and review of the records there is no clear provoking factor for this patient's VTE outside of being sedentary due to working form home. Due to recurrent episodes of pulmonary emboli, the recommendation is lifelong anticoagulation, as the cause may not be transient or reversible. We recommend 6 months or full strength anticoagulation with a re-evaluation after that time.  The patient's will then have a choice of maintenance dose DOAC (preferred, recommended), 81mg  ASA PO daily (non-preferred), or no further anticoagulation (not recommended).   #Recurrent Pulmonary Emboli --will order baseline CMP and CBC to assure labs are adequate for DOAC therapy --hypercoagulable workup from 2014 was unremarkable --recommend the patient continue Xarelto 20 mg daily.  --patient denies any bleeding, bruising, or dark stools on this medication. It is well tolerated. No difficulties accessing/affording the medication --RTC in 6 months' time with strict return precautions for overt signs of bleeding.    Orders Placed This Encounter  Procedures   CBC with Differential (Cancer Center Only)    Standing Status:   Future    Number of Occurrences:   1    Standing Expiration Date:   12/14/2022   CMP (Cancer Center only)    Standing Status:   Future    Number of Occurrences:   1    Standing Expiration Date:   12/14/2022    All questions were answered. The patient knows to call the clinic with any problems, questions or concerns.  I have spent a total of 60 minutes minutes of face-to-face and  non-face-to-face time, preparing to see the patient, obtaining and/or reviewing separately obtained history, performing a medically appropriate examination, counseling and educating the patient, ordering tests, documenting clinical information in the electronic health record, and care coordination.   Georga KaufmannIrene Earnest Mcgillis, PA-C Department of Hematology/Oncology [facility] Phone: [phone]

## 2021-12-20 DIAGNOSIS — R7309 Other abnormal glucose: Secondary | ICD-10-CM | POA: Diagnosis not present

## 2021-12-21 ENCOUNTER — Encounter: Payer: Self-pay | Admitting: *Deleted

## 2021-12-26 ENCOUNTER — Ambulatory Visit (INDEPENDENT_AMBULATORY_CARE_PROVIDER_SITE_OTHER): Payer: BC Managed Care – PPO | Admitting: Diagnostic Neuroimaging

## 2021-12-26 ENCOUNTER — Encounter: Payer: Self-pay | Admitting: Diagnostic Neuroimaging

## 2021-12-26 VITALS — BP 150/86 | HR 76 | Ht 66.0 in | Wt 211.8 lb

## 2021-12-26 DIAGNOSIS — G373 Acute transverse myelitis in demyelinating disease of central nervous system: Secondary | ICD-10-CM

## 2021-12-26 NOTE — Progress Notes (Signed)
[facility]  PATIENT: KARRIS DEANGELO DOB: 05-12-1961  REFERRING CLINICIAN: Irena Reichmann, DO  HISTORY FROM: patient  REASON FOR VISIT: follow up   HISTORICAL  CHIEF COMPLAINT:  Chief Complaint  Patient presents with   Transverse myelitis    Rm 7 Est patient last seen 2021 "PCP thought gait was off, was in pain all over; wanted me to see neurologist again"    HISTORY OF PRESENT ILLNESS:   UPDATE (12/26/21, VRP): Since last visit, doing well until April 2023. More cramps in legs. Then had PNA and PE in June 2023, and now treated. Now getting better slowly. No other new neuro symptoms.  UPDATE (08/06/19, VRP): Since last visit, doing about the same. Symptoms are stable. No alleviating or aggravating factors. No new issues.    UPDATE (12/03/18, VRP): Since last visit, doing well. Symptoms are stable. Severity is mild. No alleviating or aggravating factors. Notes some persistent left > right hand finger numbness. No other new sxs.    UPDATE (05/28/18, VRP): Since last visit, left arm numbness is stable. Low back pain slightly worse, but patient feels this is related to inactivity and slight weight gain. Severity is mild. No alleviating or aggravating factors.    UPDATE (03/05/18, VRP): Since last visit, patient is here to review test results.  CSF analysis shows greater than 5 oligoclonal bands.  Symptoms and lower extremity's are improving.  Symptoms in the left hand are persistent.  Patient also having some neck stiffness.  She has tried physical therapy which has helped.  Groin and genital numbness has resolved.  No issues with incontinence.  PRIOR HPI (12/19/17): 61 year old female here for evaluation of numbness.   11/28/2017 patient noticed rectal numbness when using the bathroom.  Patient then went to [facility] ER and had MRI lumbar spine --> showed some degenerative changes and narrowing. Then had ER visit on 12/09/17, had MRI of the thoracic and lumbar spine --> moderate  to severe spinal stenosis at L3-4 and moderate spinal stenosis at L4-5.  No thoracic spinal cord compression.  Patient followed up with orthopedic surgery clinic.  Patient was referred here for second opinion.  No bowel or bladder incontinence.  Groin and genital numbness has improved.  She still has low back pain and leg pain which is exacerbated by standing and walking.  Numbness in her feet and legs are stable.  Also has noted some numbness in her left hand.   REVIEW OF SYSTEMS: Full 14 system review of systems performed and negative with exception of: as per HPI.   ALLERGIES: No Known Allergies  HOME MEDICATIONS: Outpatient Medications Prior to Visit  Medication Sig Dispense Refill   acetaminophen (TYLENOL) 500 MG tablet Take 1,000 mg by mouth every 8 (eight) hours as needed.     Biotin w/ Vitamins C & E (HAIR/SKIN/NAILS PO) Take 1 Dose by mouth daily. Vitafusion Hair, Skin and Nails gummy vitamin.     levothyroxine (SYNTHROID, LEVOTHROID) 100 MCG tablet Take 100 mcg by mouth daily before breakfast.     Multiple Vitamins-Minerals (EYE VITAMINS) CAPS Take 1 capsule by mouth daily. Focus Select AREDS 2     rivaroxaban (XARELTO) 20 MG TABS tablet *Start after finishing Xarelto Starter Pack*. Take 1 tablet (20 mg total) by mouth daily with supper. 30 tablet 1   RIVAROXABAN (XARELTO) VTE STARTER PACK (15 & 20 MG) Follow package directions: Take one 15mg  tablet by mouth twice a day. On day 22, switch to one 20mg  tablet once  a day. Take with food. (Patient not taking: Reported on 12/26/2021) 51 each 0   No facility-administered medications prior to visit.     PHYSICAL EXAM  GENERAL EXAM/CONSTITUTIONAL: Vitals:  Vitals:   12/26/21 1420  BP: (!) 150/86  Pulse: 76  Weight: 211 lb 12.8 oz (96.1 kg)  Height: 5\' 6"  (1.676 m)   Body mass index is 34.19 kg/m. Wt Readings from Last 7 Encounters:  12/26/21 211 lb 12.8 oz (96.1 kg)  12/13/21 209 lb 12.8 oz (95.2 kg)  11/15/21 207 lb (93.9  kg)  08/06/19 202 lb 9.6 oz (91.9 kg)  12/03/18 205 lb 8 oz (93.2 kg)  05/28/18 210 lb 6.4 oz (95.4 kg)  03/06/18 201 lb 9.6 oz (91.4 kg)   Patient is in no distress; well developed, nourished and groomed; neck is supple  CARDIOVASCULAR: Examination of carotid arteries is normal; no carotid bruits Regular rate and rhythm, no murmurs Examination of peripheral vascular system by observation and palpation is normal  EYES: Ophthalmoscopic exam of optic discs and posterior segments is normal; no papilledema or hemorrhages No results found.  MUSCULOSKELETAL: Gait, strength, tone, movements noted in Neurologic exam below  NEUROLOGIC: MENTAL STATUS:      No data to display         awake, alert, oriented to person, place and time recent and remote memory intact normal attention and concentration language fluent, comprehension intact, naming intact fund of knowledge appropriate  CRANIAL NERVE:  2nd - no papilledema on fundoscopic exam 2nd, 3rd, 4th, 6th - pupils equal and reactive to light, visual fields full to confrontation, extraocular muscles intact, no nystagmus 5th - facial sensation symmetric 7th - facial strength symmetric 8th - hearing intact 9th - palate elevates symmetrically, uvula midline 11th - shoulder shrug symmetric 12th - tongue protrusion midline  MOTOR:  normal bulk and tone, full strength in the BUE, BLE  SENSORY:  normal and symmetric to light touch, temperature, vibration NEG PHALEN; NEG TINEL  COORDINATION:  finger-nose-finger, fine finger movements normal  REFLEXES:  deep tendon reflexes present and symmetric  GAIT/STATION:  narrow based gait     DIAGNOSTIC DATA (LABS, IMAGING, TESTING) - I reviewed patient records, labs, notes, testing and imaging myself where available.  Lab Results  Component Value Date   WBC 5.4 12/13/2021   HGB 12.9 12/13/2021   HCT 37.5 12/13/2021   MCV 86.4 12/13/2021   PLT 239 12/13/2021      Component  Value Date/Time   NA 142 12/13/2021 1454   K 3.4 (L) 12/13/2021 1454   CL 105 12/13/2021 1454   CO2 31 12/13/2021 1454   GLUCOSE 63 (L) 12/13/2021 1454   BUN 9 12/13/2021 1454   CREATININE 0.72 12/13/2021 1454   CALCIUM 9.4 12/13/2021 1454   PROT 7.5 12/13/2021 1454   ALBUMIN 4.1 12/13/2021 1454   AST 21 12/13/2021 1454   ALT 23 12/13/2021 1454   ALKPHOS 69 12/13/2021 1454   BILITOT 0.4 12/13/2021 1454   GFRNONAA >60 12/13/2021 1454   GFRAA >60 12/09/2017 0104   No results found for: "CHOL", "HDL", "LDLCALC", "LDLDIRECT", "TRIG", "CHOLHDL" No results found for: "HGBA1C" Lab Results  Component Value Date   VITAMINB12 447 12/09/2017   Lab Results  Component Value Date   TSH 2.364 12/09/2017    12/09/17 MR THORACIC SPINE [I reviewed images myself and agree with interpretation. -VRP]  - MRI thoracic spine demonstrates multiple small disc protrusions, but no significant spinal stenosis, or cord compression. LEFT  T10-11 foraminal narrowing, uncertain significance. - No thoracic spine fracture, intrinsic cord abnormality, visible hydromyelia, or intraspinal mass lesion.   12/09/17 MR LUMBAR SPINE [I reviewed images myself and agree with interpretation. L3-4 moderate-severe spinal stenosis; L4-5 moderate spinal stenosis. -VRP]  - Transitional anatomy.  L5-S1 disc is hypoplastic. 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 Disc pathology, short pedicles, and posterior element hypertrophy contribute to possible subarticular zone narrowing at multiple levels. See discussion above. - There are no areas of critical spinal stenosis to suggest cauda equina syndrome, although the degree of stenosis at L3-4 is moderate to severe.  12/09/17 labs -Vitamin B12, RPR, copper, HIV, ceruloplasmin, and NMO antibody --> negative  01/02/18 MRI brain [I reviewed images myself and agree with interpretation. -VRP]  1.   There are a couple punctate  T2/FLAIR hypertense foci in the subcortical white matter.  This is a nonspecific finding, common for age and likely representing minimal chronic microvascular ischemic changes. 2.   There is a normal enhancement pattern and there are no acute findings.  01/02/18 MRI cervical spine [I reviewed images myself and agree with interpretation. -VRP]  1.    There is an enhancing focus in the posterior spinal cord adjacent to C5 that slightly expands the spinal cord.  It measures 12 mm longitudinally on the postcontrast images.  Although nonspecific, this most likely represents an inflammatory or autoimmune transverse myelitis.  Neoplasm cannot be ruled out and a follow-up evaluation in a couple months is recommended. 2.    There are degenerative changes at C4-C5, C5-C6. C6-C7 and T2-T3 that do not lead to any nerve root compression.   01/02/18 MRI thoracic spine [I reviewed images myself and agree with interpretation. -VRP]  1.    The spinal cord appears normal. 2.    Multilevel degenerative changes as detailed above.  There is foraminal narrowing to the left at T10-11 encroaching upon the left T10 nerve root without causing definite nerve root compression. 3.    There is a normal enhancement pattern.  03/27/18 MRI cervical [I reviewed images myself and agree with interpretation. -VRP]  - Improving spinal cord lesion at C4-5; slightly reduced size and significantly reduced enhancement. Stable lesion at C5-6. Findings are consistent with autoimmune or inflammatory process such as idiopathic transverse myelitis. - At C4-5: disc bulging and facet hypertrophy with moderate right foraminal stenosis; posterior central intrinsic T2 hyperintense spinal cord lesion noted. - At C5-6: disc bulging and facet hypertrophy with mild biforaminal stenosis; subtle right lateral spinal cord T2 hyperintensity also noted, stable from prior study.  11/06/17 MRI brain  - Few punctate subcortical foci of non-specific gliosis.  - No  abnormal enhancing lesions. - No change from MRI on 01/02/18.  11/07/18 MRI brain (with and without) demonstrating: - Few punctate subcortical foci of non-specific gliosis.  - No abnormal enhancing lesions. - No change from MRI on 01/02/18.  02/18/18 CSF - The patient's CSF contains >5 well defined gamma restriction bands that are not present in the patient's corresponding serum sample. - WBC 6, RBC 47, glucose 51, protein 69 - cytology: negative for malignancy  01/09/19 EMG / NCV (bilateral upper ext) - This is a normal study.  No electrodiagnostic evidence of large fiber neuropathy at this time.  12/16/19  - Unremarkable MRI brain (with and without). Few stable foci of subcortical gliosis, unchanged compared to 11/07/18.    ASSESSMENT AND PLAN  61 y.o. year old female here  with new onset numbness from mid torso down to bilateral lower extremities on November 28, 2016 associated with genital and groin numbness, now gradually improving.   Neurologic examination was notable for decreased sensation in the lower extremities, brisk reflexes at the knees, and unsteady gait.  MRI of the thoracic spine was reviewed and no evidence of intrinsic spinal cord lesions.  MRI of the lumbar spine shows significant spinal stenosis which could account for patient's gait difficulty.  However patient does report numbness in left hand and resolving perineal numbness which are otherwise not well explained.  Therefore we did further work-up with additional CNS imaging studies --> enhancing cervical spinal cord lesion found, with + OCB on CSF testing. 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 Transverse myelitis (HCC)      PLAN:  GAIT DIFFICULTY - likely exacerbation from PNA and PE; now improving - if not resolved; then consider MRI brain, cervical spine  TRANSVERSE MYELITIS (idiopathic) - monitor  LUMBAR SPINAL  STENOSIS - conservative mgmt for now; exercise, nutrition reviewed; follow up with orthopedic clinic (Dr. Shon Baton)  LEFT HAND NUMBNESS (stable) - likely  sequelae of transverse myelitis  Return for pending if symptoms worsen or fail to improve.    Suanne Marker, MD 12/26/2021, 2:40 PM Certified in Neurology, Neurophysiology and Neuroimaging  [facility] [address] [place], [place] [address] [phone]

## 2022-01-02 DIAGNOSIS — R9389 Abnormal findings on diagnostic imaging of other specified body structures: Secondary | ICD-10-CM | POA: Diagnosis not present

## 2022-01-02 DIAGNOSIS — I2699 Other pulmonary embolism without acute cor pulmonale: Secondary | ICD-10-CM | POA: Diagnosis not present

## 2022-01-02 DIAGNOSIS — Z09 Encounter for follow-up examination after completed treatment for conditions other than malignant neoplasm: Secondary | ICD-10-CM | POA: Diagnosis not present

## 2022-01-02 DIAGNOSIS — E890 Postprocedural hypothyroidism: Secondary | ICD-10-CM | POA: Diagnosis not present

## 2022-01-20 DIAGNOSIS — R7309 Other abnormal glucose: Secondary | ICD-10-CM | POA: Diagnosis not present

## 2022-01-27 ENCOUNTER — Other Ambulatory Visit: Payer: Self-pay | Admitting: *Deleted

## 2022-01-27 MED ORDER — RIVAROXABAN 20 MG PO TABS: 20.0000 mg | ORAL_TABLET | Freq: Every day | ORAL | 3 refills | Status: DC

## 2022-02-19 DIAGNOSIS — R7309 Other abnormal glucose: Secondary | ICD-10-CM | POA: Diagnosis not present

## 2022-02-20 ENCOUNTER — Telehealth: Payer: Self-pay | Admitting: Diagnostic Neuroimaging

## 2022-02-20 DIAGNOSIS — G373 Acute transverse myelitis in demyelinating disease of central nervous system: Secondary | ICD-10-CM

## 2022-02-20 NOTE — Telephone Encounter (Signed)
Pt is calling. Stated she wants a repeat Scan done of the brain and neck. Pt said Dr. Leta Baptist told her to call if she wanted the Scan done.

## 2022-02-20 NOTE — Telephone Encounter (Signed)
Pt is still having some gait impairment and numbness, declined any new symptoms. Per last note 8/7, if it doesn't improve, then consider MRI brain, cervical spine.  Ok to place orders for MRIs ?

## 2022-02-22 NOTE — Addendum Note (Signed)
Addended by: Gertie Baron D on: 02/22/2022 08:09 AM   Modules accepted: Orders

## 2022-02-24 ENCOUNTER — Telehealth: Payer: Self-pay | Admitting: Diagnostic Neuroimaging

## 2022-02-24 NOTE — Telephone Encounter (Signed)
[employer] Josem Kaufmann: [id number] exp. 02/24/22-03/25/22 sent to GI [phone]

## 2022-03-15 DIAGNOSIS — H52203 Unspecified astigmatism, bilateral: Secondary | ICD-10-CM | POA: Diagnosis not present

## 2022-03-15 DIAGNOSIS — H5213 Myopia, bilateral: Secondary | ICD-10-CM | POA: Diagnosis not present

## 2022-03-15 DIAGNOSIS — H25013 Cortical age-related cataract, bilateral: Secondary | ICD-10-CM | POA: Diagnosis not present

## 2022-03-15 DIAGNOSIS — H43813 Vitreous degeneration, bilateral: Secondary | ICD-10-CM | POA: Diagnosis not present

## 2022-03-15 DIAGNOSIS — H2513 Age-related nuclear cataract, bilateral: Secondary | ICD-10-CM | POA: Diagnosis not present

## 2022-03-18 ENCOUNTER — Ambulatory Visit
Admission: RE | Admit: 2022-03-18 | Discharge: 2022-03-18 | Disposition: A | Discharge status: Home / Self Care | Payer: BC Managed Care – PPO | Source: Ambulatory Visit | Attending: Diagnostic Neuroimaging | Admitting: Diagnostic Neuroimaging

## 2022-03-18 DIAGNOSIS — G373 Acute transverse myelitis in demyelinating disease of central nervous system: Secondary | ICD-10-CM

## 2022-03-18 MED ORDER — GADOPICLENOL 0.5 MMOL/ML IV SOLN
10.0000 mL | Freq: Once | INTRAVENOUS | Status: AC | PRN
  Administered 2022-03-18: 10 mL via INTRAVENOUS

## 2022-03-22 DIAGNOSIS — R7309 Other abnormal glucose: Secondary | ICD-10-CM | POA: Diagnosis not present

## 2022-03-28 DIAGNOSIS — R200 Anesthesia of skin: Secondary | ICD-10-CM | POA: Diagnosis not present

## 2022-04-05 DIAGNOSIS — Z1231 Encounter for screening mammogram for malignant neoplasm of breast: Secondary | ICD-10-CM | POA: Diagnosis not present

## 2022-04-21 DIAGNOSIS — R7309 Other abnormal glucose: Secondary | ICD-10-CM | POA: Diagnosis not present

## 2022-04-26 DIAGNOSIS — N6325 Unspecified lump in the left breast, overlapping quadrants: Secondary | ICD-10-CM | POA: Diagnosis not present

## 2022-05-01 ENCOUNTER — Telehealth: Payer: Self-pay | Admitting: Hematology and Oncology

## 2022-05-01 NOTE — Telephone Encounter (Signed)
Called patient to r/s January appointment due to provider PAL. Patient r/s and notified.  

## 2022-05-08 DIAGNOSIS — H35363 Drusen (degenerative) of macula, bilateral: Secondary | ICD-10-CM | POA: Diagnosis not present

## 2022-05-08 DIAGNOSIS — H33321 Round hole, right eye: Secondary | ICD-10-CM | POA: Diagnosis not present

## 2022-05-08 DIAGNOSIS — H43813 Vitreous degeneration, bilateral: Secondary | ICD-10-CM | POA: Diagnosis not present

## 2022-05-08 DIAGNOSIS — H25813 Combined forms of age-related cataract, bilateral: Secondary | ICD-10-CM | POA: Diagnosis not present

## 2022-05-12 DIAGNOSIS — H33321 Round hole, right eye: Secondary | ICD-10-CM | POA: Diagnosis not present

## 2022-05-22 DIAGNOSIS — R7309 Other abnormal glucose: Secondary | ICD-10-CM | POA: Diagnosis not present

## 2022-05-31 DIAGNOSIS — M48061 Spinal stenosis, lumbar region without neurogenic claudication: Secondary | ICD-10-CM | POA: Diagnosis not present

## 2022-05-31 DIAGNOSIS — M5030 Other cervical disc degeneration, unspecified cervical region: Secondary | ICD-10-CM | POA: Diagnosis not present

## 2022-05-31 DIAGNOSIS — G373 Acute transverse myelitis in demyelinating disease of central nervous system: Secondary | ICD-10-CM | POA: Diagnosis not present

## 2022-05-31 DIAGNOSIS — M5126 Other intervertebral disc displacement, lumbar region: Secondary | ICD-10-CM | POA: Diagnosis not present

## 2022-06-06 ENCOUNTER — Other Ambulatory Visit: Payer: Self-pay | Admitting: Physician Assistant

## 2022-06-16 ENCOUNTER — Other Ambulatory Visit: Payer: BC Managed Care – PPO

## 2022-06-16 ENCOUNTER — Ambulatory Visit: Payer: BC Managed Care – PPO | Admitting: Hematology and Oncology

## 2022-06-22 DIAGNOSIS — R7309 Other abnormal glucose: Secondary | ICD-10-CM | POA: Diagnosis not present

## 2022-06-25 ENCOUNTER — Other Ambulatory Visit: Payer: Self-pay | Admitting: Hematology and Oncology

## 2022-06-25 DIAGNOSIS — I2699 Other pulmonary embolism without acute cor pulmonale: Secondary | ICD-10-CM

## 2022-06-26 ENCOUNTER — Inpatient Hospital Stay: Payer: BC Managed Care – PPO | Attending: Hematology and Oncology

## 2022-06-26 ENCOUNTER — Inpatient Hospital Stay: Payer: BC Managed Care – PPO | Admitting: Hematology and Oncology

## 2022-06-26 VITALS — BP 178/98 | HR 88 | Temp 98.2°F | Resp 15 | Wt 209.3 lb

## 2022-06-26 DIAGNOSIS — I2699 Other pulmonary embolism without acute cor pulmonale: Secondary | ICD-10-CM | POA: Diagnosis not present

## 2022-06-26 DIAGNOSIS — Z86711 Personal history of pulmonary embolism: Secondary | ICD-10-CM | POA: Insufficient documentation

## 2022-06-26 DIAGNOSIS — Z79890 Hormone replacement therapy (postmenopausal): Secondary | ICD-10-CM | POA: Insufficient documentation

## 2022-06-26 DIAGNOSIS — Z7901 Long term (current) use of anticoagulants: Secondary | ICD-10-CM | POA: Insufficient documentation

## 2022-06-26 DIAGNOSIS — E0500 Thyrotoxicosis with diffuse goiter without thyrotoxic crisis or storm: Secondary | ICD-10-CM | POA: Diagnosis not present

## 2022-06-26 DIAGNOSIS — Z803 Family history of malignant neoplasm of breast: Secondary | ICD-10-CM | POA: Diagnosis not present

## 2022-06-26 DIAGNOSIS — G373 Acute transverse myelitis in demyelinating disease of central nervous system: Secondary | ICD-10-CM | POA: Diagnosis not present

## 2022-06-26 DIAGNOSIS — E890 Postprocedural hypothyroidism: Secondary | ICD-10-CM | POA: Diagnosis not present

## 2022-06-26 DIAGNOSIS — Z79899 Other long term (current) drug therapy: Secondary | ICD-10-CM | POA: Diagnosis not present

## 2022-06-26 DIAGNOSIS — M5030 Other cervical disc degeneration, unspecified cervical region: Secondary | ICD-10-CM | POA: Diagnosis not present

## 2022-06-26 LAB — CBC WITH DIFFERENTIAL (CANCER CENTER ONLY)
Abs Immature Granulocytes: 0.01 10*3/uL (ref 0.00–0.07)
Basophils Absolute: 0 10*3/uL (ref 0.0–0.1)
Basophils Relative: 0 %
Eosinophils Absolute: 0.1 10*3/uL (ref 0.0–0.5)
Eosinophils Relative: 1 %
HCT: 40.2 % (ref 36.0–46.0)
Hemoglobin: 14.1 g/dL (ref 12.0–15.0)
Immature Granulocytes: 0 %
Lymphocytes Relative: 37 %
Lymphs Abs: 2.1 10*3/uL (ref 0.7–4.0)
MCH: 30.5 pg (ref 26.0–34.0)
MCHC: 35.1 g/dL (ref 30.0–36.0)
MCV: 86.8 fL (ref 80.0–100.0)
Monocytes Absolute: 0.6 10*3/uL (ref 0.1–1.0)
Monocytes Relative: 11 %
Neutro Abs: 2.9 10*3/uL (ref 1.7–7.7)
Neutrophils Relative %: 51 %
Platelet Count: 248 10*3/uL (ref 150–400)
RBC: 4.63 MIL/uL (ref 3.87–5.11)
RDW: 12.9 % (ref 11.5–15.5)
WBC Count: 5.7 10*3/uL (ref 4.0–10.5)
nRBC: 0 % (ref 0.0–0.2)

## 2022-06-26 LAB — CMP (CANCER CENTER ONLY)
ALT: 19 U/L (ref 0–44)
AST: 20 U/L (ref 15–41)
Albumin: 4.4 g/dL (ref 3.5–5.0)
Alkaline Phosphatase: 64 U/L (ref 38–126)
Anion gap: 8 (ref 5–15)
BUN: 12 mg/dL (ref 8–23)
CO2: 30 mmol/L (ref 22–32)
Calcium: 9.7 mg/dL (ref 8.9–10.3)
Chloride: 103 mmol/L (ref 98–111)
Creatinine: 0.62 mg/dL (ref 0.44–1.00)
GFR, Estimated: >60 mL/min (ref >60)
Glucose, Bld: 73 mg/dL (ref 70–99)
Potassium: 3.5 mmol/L (ref 3.5–5.1)
Sodium: 141 mmol/L (ref 135–145)
Total Bilirubin: 0.4 mg/dL (ref 0.3–1.2)
Total Protein: 7.7 g/dL (ref 6.5–8.1)

## 2022-06-26 MED ORDER — RIVAROXABAN 10 MG PO TABS: 10.0000 mg | ORAL_TABLET | Freq: Every day | ORAL | 2 refills | Status: DC

## 2022-06-26 NOTE — Progress Notes (Signed)
[place] [phone]   [phone]  PROGRESS NOTE  Patient Care Team: [facility] [place] as PCP - General (Rheumatology)  Hematological/Oncological History # Recurrent Pulmonary Emboli  1) 07/31/2012: Presented with acute chest pain. CT scan showed acute pulmonary embolism within the right lower lobe segmental branch.  She was discharged home on warfarin.  Underwent hypercoagulable work-up that was unremarkable.   2) 09/11/2012: Establish care with hematology/oncology with Dr. Alen Blew on 09/11/2012.  Felt that PE was unprovoked and recommended duration of anticoagulation for 6 months and then addition to low-dose aspirin 81 mg.   3) 11/15/2021-11/16/2021: Presented with cough, congestion and left pleuritic chest pain.  CT angiogram showed small acute pulmonary embolus involving the segmental vessels at the lingula and left upper lobe.  There was no evidence of DVTs.  Patient was discharged on Xarelto.  4) 12/13/2021: establish care with Dr. Lorenso Courier   Interval History:  Cynthia Villegas 62 y.o. female with medical history significant for recurrent pulmonary emboli presents for a follow up visit. The patient's last visit was on 12/13/2021 at which time she established care. In the interim since the last visit she has continued on Xarelto therapy without difficulty.  On exam today Cynthia Villegas notes that she continues her Xarelto 20 mg p.o. daily without any difficulty.  She is not having any trouble with bleeding, bruising, or dark stools.  She does occasionally have some gum bleeding.  She reports that she is not having any overt blood in her urine or stool.  She reports that she currently pays about $10 per month for the medication.  She notes that she has no signs or symptoms concerning for recurrent VTE such as leg pain, leg swelling, erythema, chest pain, shortness of breath.  She reports that she has not had any other changes in her health and has not  started any new medications.  She does not have any upcoming dental work or surgical procedures.  Her blood pressure is elevated today but she denies any headache, chest pain, or shortness of breath.  A full 10 point ROS was otherwise negative.  MEDICAL HISTORY:  Past Medical History:  Diagnosis Date   Back pain    DDD (degenerative disc disease), cervical    Graves disease    History of blood clots 2014   Lumbar stenosis    Transverse myelitis ([place])     SURGICAL HISTORY: Past Surgical History:  Procedure Laterality Date   OTHER SURGICAL HISTORY     Hysterectomy    SOCIAL HISTORY: Social History   Socioeconomic History   Marital status: Single    Spouse name: Not on file   Number of children: 1   Years of education: 12   Highest education level: Not on file  Occupational History   Not on file  Tobacco Use   Smoking status: Never   Smokeless tobacco: Never  Substance and Sexual Activity   Alcohol use: Yes    Comment: occasional   Drug use: No   Sexual activity: Not on file  Other Topics Concern   Not on file  Social History Narrative   Lives home alone.  Has one child.  Working from home now, but normally works in office setting.  Education HS grad.     Social Determinants of Health   Financial Resource Strain: Not on file  Food Insecurity: Not on file  Transportation Needs: Not on file  Physical Activity: Not on file  Stress: Not on  file  Social Connections: Not on file  Intimate Partner Violence: Not on file    FAMILY HISTORY: Family History  Problem Relation Age of Onset   Diabetes Mother    Breast cancer Mother    Dementia Mother    Heart disease Father     ALLERGIES:  has No Known Allergies.  MEDICATIONS:  Current Outpatient Medications  Medication Sig Dispense Refill   rivaroxaban (XARELTO) 10 MG TABS tablet Take 1 tablet (10 mg total) by mouth daily. 90 tablet 2   acetaminophen (TYLENOL) 500 MG tablet Take 1,000 mg by mouth every 8 (eight)  hours as needed.     Biotin w/ Vitamins C & E (HAIR/SKIN/NAILS PO) Take 1 Dose by mouth daily. Vitafusion Hair, Skin and Nails gummy vitamin.     levothyroxine (SYNTHROID, LEVOTHROID) 100 MCG tablet Take 100 mcg by mouth daily before breakfast.     Multiple Vitamins-Minerals (EYE VITAMINS) CAPS Take 1 capsule by mouth daily. Focus Select AREDS 2     No current facility-administered medications for this visit.    REVIEW OF SYSTEMS:   Constitutional: ( - ) fevers, ( - )  chills , ( - ) night sweats Eyes: ( - ) blurriness of vision, ( - ) double vision, ( - ) watery eyes Ears, nose, mouth, throat, and face: ( - ) mucositis, ( - ) sore throat Respiratory: ( - ) cough, ( - ) dyspnea, ( - ) wheezes Cardiovascular: ( - ) palpitation, ( - ) chest discomfort, ( - ) lower extremity swelling Gastrointestinal:  ( - ) nausea, ( - ) heartburn, ( - ) change in bowel habits Skin: ( - ) abnormal skin rashes Lymphatics: ( - ) new lymphadenopathy, ( - ) easy bruising Neurological: ( - ) numbness, ( - ) tingling, ( - ) new weaknesses Behavioral/Psych: ( - ) mood change, ( - ) new changes  All other systems were reviewed with the patient and are negative.  PHYSICAL EXAMINATION:  Vitals:   06/26/22 1503  BP: (!) 178/98  Pulse: 88  Resp: 15  Temp: 98.2 F (36.8 C)  SpO2: 100%   Filed Weights   06/26/22 1503  Weight: 209 lb 4.8 oz (94.9 kg)    GENERAL: Well-appearing middle-aged African-American female, alert, no distress and comfortable SKIN: skin color, texture, turgor are normal, no rashes or significant lesions EYES: conjunctiva are pink and non-injected, sclera clear LUNGS: clear to auscultation and percussion with normal breathing effort HEART: regular rate & rhythm and no murmurs and no lower extremity edema Musculoskeletal: no cyanosis of digits and no clubbing  PSYCH: alert & oriented x 3, fluent speech NEURO: no focal motor/sensory deficits  LABORATORY DATA:  I have reviewed the data  as listed    Latest Ref Rng & Units 06/26/2022    2:46 PM 12/13/2021    2:54 PM 11/16/2021    4:22 AM  CBC  WBC 4.0 - 10.5 K/uL 5.7  5.4  10.2   Hemoglobin 12.0 - 15.0 g/dL 14.1  12.9  13.8   Hematocrit 36.0 - 46.0 % 40.2  37.5  40.3   Platelets 150 - 400 K/uL 248  239  183        Latest Ref Rng & Units 06/26/2022    2:46 PM 12/13/2021    2:54 PM 11/16/2021    4:22 AM  CMP  Glucose 70 - 99 mg/dL 73  63  93   BUN 8 - 23 mg/dL 12  9  8   Creatinine 0.44 - 1.00 mg/dL 0.62  0.72  0.69   Sodium 135 - 145 mmol/L 141  142  140   Potassium 3.5 - 5.1 mmol/L 3.5  3.4  3.2   Chloride 98 - 111 mmol/L 103  105  103   CO2 22 - 32 mmol/L 30  31  23    Calcium 8.9 - 10.3 mg/dL 9.7  9.4  9.2   Total Protein 6.5 - 8.1 g/dL 7.7  7.5    Total Bilirubin 0.3 - 1.2 mg/dL 0.4  0.4    Alkaline Phos 38 - 126 U/L 64  69    AST 15 - 41 U/L 20  21    ALT 0 - 44 U/L 19  23       RADIOGRAPHIC STUDIES: No results found.  ASSESSMENT & PLAN Cynthia Villegas 62 y.o. female with medical history significant for recurrent pulmonary emboli presents for a follow up visit.  A provoked venous thromboembolism (VTE) is one that has a clear inciting factor or event. Provoking factors include prolonged travel/immobility, surgery (particular abdominal or orthropedic), trauma,  and pregnancy/ estrogen containing birth control. After a detailed history and review of the records there is no clear provoking factor for this patient's VTE outside of being sedentary due to working form home. Due to recurrent episodes of pulmonary emboli, the recommendation is lifelong anticoagulation, as the cause may not be transient or reversible. We recommend 6 months or full strength anticoagulation with a re-evaluation after that time.  The patient's will then have a choice of maintenance dose DOAC (preferred, recommended), 81mg  ASA PO daily (non-preferred), or no further anticoagulation (not recommended).    #Recurrent Pulmonary Emboli --will  order baseline CMP and CBC to assure labs are adequate for DOAC therapy --hypercoagulable workup from 2014 was unremarkable --recommend the patient continue Xarelto 20 mg daily until she completes her current supply.  Then will transition to Xarelto 10 mg p.o. daily maintenance therapy. --patient denies any bleeding, bruising, or dark stools on this medication. It is well tolerated. No difficulties accessing/affording the medication --Labs today show white blood cell count 5.7, hemoglobin 14.1, MCV 86.8, and platelets of 248.  Additionally creatinine and liver functions are within normal limits. --RTC in 6 months' time with strict return precautions for overt signs of bleeding.  No orders of the defined types were placed in this encounter.   All questions were answered. The patient knows to call the clinic with any problems, questions or concerns.  A total of more than 30 minutes were spent on this encounter with face-to-face time and non-face-to-face time, including preparing to see the patient, ordering tests and/or medications, counseling the patient and coordination of care as outlined above.   Ledell Peoples, MD Department of Hematology/Oncology [place] [facility] Phone: [phone] Pager: [phone] Email: [email or web address]  06/26/2022 4:05 PM

## 2022-07-03 DIAGNOSIS — Z6833 Body mass index (BMI) 33.0-33.9, adult: Secondary | ICD-10-CM | POA: Diagnosis not present

## 2022-07-03 DIAGNOSIS — M4316 Spondylolisthesis, lumbar region: Secondary | ICD-10-CM | POA: Diagnosis not present

## 2022-07-03 DIAGNOSIS — M47816 Spondylosis without myelopathy or radiculopathy, lumbar region: Secondary | ICD-10-CM | POA: Diagnosis not present

## 2022-07-10 ENCOUNTER — Other Ambulatory Visit: Payer: Self-pay | Admitting: Neurological Surgery

## 2022-07-10 DIAGNOSIS — M4316 Spondylolisthesis, lumbar region: Secondary | ICD-10-CM

## 2022-07-18 ENCOUNTER — Ambulatory Visit
Admission: RE | Admit: 2022-07-18 | Discharge: 2022-07-18 | Disposition: A | Discharge status: Home / Self Care | Payer: BC Managed Care – PPO | Source: Ambulatory Visit | Attending: Neurological Surgery | Admitting: Neurological Surgery

## 2022-07-18 DIAGNOSIS — M48061 Spinal stenosis, lumbar region without neurogenic claudication: Secondary | ICD-10-CM | POA: Diagnosis not present

## 2022-07-18 DIAGNOSIS — M4726 Other spondylosis with radiculopathy, lumbar region: Secondary | ICD-10-CM | POA: Diagnosis not present

## 2022-07-18 DIAGNOSIS — M4316 Spondylolisthesis, lumbar region: Secondary | ICD-10-CM

## 2022-07-18 DIAGNOSIS — M5117 Intervertebral disc disorders with radiculopathy, lumbosacral region: Secondary | ICD-10-CM | POA: Diagnosis not present

## 2022-07-21 DIAGNOSIS — R7309 Other abnormal glucose: Secondary | ICD-10-CM | POA: Diagnosis not present

## 2022-08-01 DIAGNOSIS — I10 Essential (primary) hypertension: Secondary | ICD-10-CM | POA: Diagnosis not present

## 2022-08-02 DIAGNOSIS — M4316 Spondylolisthesis, lumbar region: Secondary | ICD-10-CM | POA: Diagnosis not present

## 2022-08-09 ENCOUNTER — Telehealth: Payer: Self-pay | Admitting: *Deleted

## 2022-08-09 NOTE — Telephone Encounter (Signed)
Per patient request, copy of labs from 06/26/22 faxed to Dr Janie Morning with [facility], Fax [phone].  Fax confirmation received.

## 2022-08-10 ENCOUNTER — Telehealth: Payer: Self-pay | Admitting: *Deleted

## 2022-08-10 NOTE — Telephone Encounter (Signed)
Signed form faxed to [facility]

## 2022-08-21 DIAGNOSIS — R7309 Other abnormal glucose: Secondary | ICD-10-CM | POA: Diagnosis not present

## 2022-09-20 DIAGNOSIS — R7309 Other abnormal glucose: Secondary | ICD-10-CM | POA: Diagnosis not present

## 2022-10-11 DIAGNOSIS — H33321 Round hole, right eye: Secondary | ICD-10-CM | POA: Diagnosis not present

## 2022-10-11 DIAGNOSIS — H31093 Other chorioretinal scars, bilateral: Secondary | ICD-10-CM | POA: Diagnosis not present

## 2022-10-11 DIAGNOSIS — H35363 Drusen (degenerative) of macula, bilateral: Secondary | ICD-10-CM | POA: Diagnosis not present

## 2022-10-11 DIAGNOSIS — H353131 Nonexudative age-related macular degeneration, bilateral, early dry stage: Secondary | ICD-10-CM | POA: Diagnosis not present

## 2022-10-21 DIAGNOSIS — R7309 Other abnormal glucose: Secondary | ICD-10-CM | POA: Diagnosis not present

## 2022-11-15 DIAGNOSIS — H33321 Round hole, right eye: Secondary | ICD-10-CM | POA: Diagnosis not present

## 2022-11-20 DIAGNOSIS — E559 Vitamin D deficiency, unspecified: Secondary | ICD-10-CM | POA: Diagnosis not present

## 2022-11-20 DIAGNOSIS — Z13220 Encounter for screening for lipoid disorders: Secondary | ICD-10-CM | POA: Diagnosis not present

## 2022-11-20 DIAGNOSIS — R7309 Other abnormal glucose: Secondary | ICD-10-CM | POA: Diagnosis not present

## 2022-11-20 DIAGNOSIS — E890 Postprocedural hypothyroidism: Secondary | ICD-10-CM | POA: Diagnosis not present

## 2022-11-20 DIAGNOSIS — Z79899 Other long term (current) drug therapy: Secondary | ICD-10-CM | POA: Diagnosis not present

## 2022-11-20 DIAGNOSIS — I10 Essential (primary) hypertension: Secondary | ICD-10-CM | POA: Diagnosis not present

## 2022-11-20 DIAGNOSIS — R7303 Prediabetes: Secondary | ICD-10-CM | POA: Diagnosis not present

## 2022-11-27 DIAGNOSIS — M48061 Spinal stenosis, lumbar region without neurogenic claudication: Secondary | ICD-10-CM | POA: Diagnosis not present

## 2022-11-27 DIAGNOSIS — G373 Acute transverse myelitis in demyelinating disease of central nervous system: Secondary | ICD-10-CM | POA: Diagnosis not present

## 2022-11-27 DIAGNOSIS — R208 Other disturbances of skin sensation: Secondary | ICD-10-CM | POA: Diagnosis not present

## 2022-11-27 DIAGNOSIS — Z0000 Encounter for general adult medical examination without abnormal findings: Secondary | ICD-10-CM | POA: Diagnosis not present

## 2022-12-21 DIAGNOSIS — R7309 Other abnormal glucose: Secondary | ICD-10-CM | POA: Diagnosis not present

## 2022-12-22 ENCOUNTER — Telehealth: Payer: Self-pay | Admitting: *Deleted

## 2022-12-22 NOTE — Telephone Encounter (Signed)
Received call from pt regarding lab appt on 12/25/22. She states she had labs done at her PCP office on 11/20/22. She is wondering if she needed to have labs done here on 12/25/22. Advised that those labs were adequate and we did not need to repeat them. Lab appt is cancelled.  Pt aware that will still need to see Dr. Leonides Schanz

## 2022-12-25 ENCOUNTER — Other Ambulatory Visit: Payer: Self-pay | Admitting: Hematology and Oncology

## 2022-12-25 ENCOUNTER — Inpatient Hospital Stay: Payer: BC Managed Care – PPO | Attending: Hematology and Oncology | Admitting: Hematology and Oncology

## 2022-12-25 ENCOUNTER — Inpatient Hospital Stay: Payer: BC Managed Care – PPO

## 2022-12-25 ENCOUNTER — Other Ambulatory Visit: Payer: Self-pay

## 2022-12-25 VITALS — BP 145/92 | HR 91 | Temp 98.5°F | Resp 17 | Wt 206.4 lb

## 2022-12-25 DIAGNOSIS — Z7901 Long term (current) use of anticoagulants: Secondary | ICD-10-CM | POA: Insufficient documentation

## 2022-12-25 DIAGNOSIS — I2699 Other pulmonary embolism without acute cor pulmonale: Secondary | ICD-10-CM

## 2022-12-25 DIAGNOSIS — Z86711 Personal history of pulmonary embolism: Secondary | ICD-10-CM | POA: Diagnosis not present

## 2022-12-25 DIAGNOSIS — M5030 Other cervical disc degeneration, unspecified cervical region: Secondary | ICD-10-CM | POA: Diagnosis not present

## 2022-12-25 DIAGNOSIS — G373 Acute transverse myelitis in demyelinating disease of central nervous system: Secondary | ICD-10-CM | POA: Diagnosis not present

## 2022-12-25 DIAGNOSIS — Z803 Family history of malignant neoplasm of breast: Secondary | ICD-10-CM | POA: Insufficient documentation

## 2022-12-25 DIAGNOSIS — M48061 Spinal stenosis, lumbar region without neurogenic claudication: Secondary | ICD-10-CM | POA: Insufficient documentation

## 2022-12-25 DIAGNOSIS — Z79899 Other long term (current) drug therapy: Secondary | ICD-10-CM | POA: Insufficient documentation

## 2022-12-25 NOTE — Progress Notes (Signed)
[facility] [phone]   [phone]  PROGRESS NOTE  Patient Care Team: Associates, [facility] as PCP - General (Rheumatology)  Hematological/Oncological History # Recurrent Pulmonary Emboli  1) 07/31/2012: Presented with acute chest pain. CT scan showed acute pulmonary embolism within the right lower lobe segmental branch.  She was discharged home on warfarin.  Underwent hypercoagulable work-up that was unremarkable.   2) 09/11/2012: Establish care with hematology/oncology with Dr. Clelia Villegas on 09/11/2012.  Felt that PE was unprovoked and recommended duration of anticoagulation for 6 months and then addition to low-dose aspirin 81 mg.   3) 11/15/2021-11/16/2021: Presented with cough, congestion and left pleuritic chest pain.  CT angiogram showed small acute pulmonary embolus involving the segmental vessels at the lingula and left upper lobe.  There was no evidence of DVTs.  Patient was discharged on Xarelto.  4) 12/13/2021: establish care with Dr. Leonides Villegas   Interval History:  Cynthia Villegas 62 y.o. female with medical history significant for recurrent pulmonary emboli presents for a follow up visit. The patient's last visit was on 06/26/2022 at which time she established care. In the interim since the last visit she has continued on Xarelto therapy without difficulty.  On exam today Cynthia Villegas notes has been doing well overall in the interim since her last visit.  She continues taking her Xarelto 10 mg p.o. daily without any difficulty.  She notes that she is having little bit of bleeding from a tooth and is going to be seen by a dentist.  She reports a 69-month supply cost of $30.  She is not having any signs or symptoms concerning for recurrent VTE.  She denies any leg swelling, leg pain, chest pain, or shortness of breath.  She has had no missed doses of the medication.  She reports that she continues to work and wears compression socks to prevent pooling  of the blood in her lower extremities.  She has had no recent infectious symptoms such as runny nose, sore throat, or cough.  She also denies any fevers, chills, sweats.  She reports her blood pressure is typically high and she is working with this on her PCP.  A full 10 point ROS was otherwise negative.  MEDICAL HISTORY:  Past Medical History:  Diagnosis Date   Back pain    DDD (degenerative disc disease), cervical    Graves disease    History of blood clots 2014   Lumbar stenosis    Transverse myelitis (HCC)     SURGICAL HISTORY: Past Surgical History:  Procedure Laterality Date   OTHER SURGICAL HISTORY     Hysterectomy    SOCIAL HISTORY: Social History   Socioeconomic History   Marital status: Single    Spouse name: Not on file   Number of children: 1   Years of education: 12   Highest education level: Not on file  Occupational History   Not on file  Tobacco Use   Smoking status: Never   Smokeless tobacco: Never  Substance and Sexual Activity   Alcohol use: Yes    Comment: occasional   Drug use: No   Sexual activity: Not on file  Other Topics Concern   Not on file  Social History Narrative   Lives home alone.  Has one child.  Working from home now, but normally works in office setting.  Education HS grad.     Social Determinants of Health   Financial Resource Strain: Not on file  Food Insecurity: Not  on file  Transportation Needs: Not on file  Physical Activity: Not on file  Stress: Not on file  Social Connections: Not on file  Intimate Partner Violence: Not on file    FAMILY HISTORY: Family History  Problem Relation Age of Onset   Diabetes Mother    Breast cancer Mother    Dementia Mother    Heart disease Father     ALLERGIES:  has No Known Allergies.  MEDICATIONS:  Current Outpatient Medications  Medication Sig Dispense Refill   acetaminophen (TYLENOL) 500 MG tablet Take 1,000 mg by mouth every 8 (eight) hours as needed.     Biotin w/  Vitamins C & E (HAIR/SKIN/NAILS PO) Take 1 Dose by mouth daily. Vitafusion Hair, Skin and Nails gummy vitamin.     levothyroxine (SYNTHROID, LEVOTHROID) 100 MCG tablet Take 100 mcg by mouth daily before breakfast.     Multiple Vitamins-Minerals (EYE VITAMINS) CAPS Take 1 capsule by mouth daily. Focus Select AREDS 2     rivaroxaban (XARELTO) 10 MG TABS tablet Take 1 tablet (10 mg total) by mouth daily. 90 tablet 2   No current facility-administered medications for this visit.    REVIEW OF SYSTEMS:   Constitutional: ( - ) fevers, ( - )  chills , ( - ) night sweats Eyes: ( - ) blurriness of vision, ( - ) double vision, ( - ) watery eyes Ears, nose, mouth, throat, and face: ( - ) mucositis, ( - ) sore throat Respiratory: ( - ) cough, ( - ) dyspnea, ( - ) wheezes Cardiovascular: ( - ) palpitation, ( - ) chest discomfort, ( - ) lower extremity swelling Gastrointestinal:  ( - ) nausea, ( - ) heartburn, ( - ) change in bowel habits Skin: ( - ) abnormal skin rashes Lymphatics: ( - ) new lymphadenopathy, ( - ) easy bruising Neurological: ( - ) numbness, ( - ) tingling, ( - ) new weaknesses Behavioral/Psych: ( - ) mood change, ( - ) new changes  All other systems were reviewed with the patient and are negative.  PHYSICAL EXAMINATION:  Vitals:   12/25/22 1446  BP: (!) 145/92  Pulse: 91  Resp: 17  Temp: 98.5 F (36.9 C)  SpO2: 97%    Filed Weights   12/25/22 1446  Weight: 206 lb 6.4 oz (93.6 kg)     GENERAL: Well-appearing middle-aged African-American female, alert, no distress and comfortable SKIN: skin color, texture, turgor are normal, no rashes or significant lesions EYES: conjunctiva are pink and non-injected, sclera clear LUNGS: clear to auscultation and percussion with normal breathing effort HEART: regular rate & rhythm and no murmurs and no lower extremity edema Musculoskeletal: no cyanosis of digits and no clubbing  PSYCH: alert & oriented x 3, fluent speech NEURO: no focal  motor/sensory deficits  LABORATORY DATA:  I have reviewed the data as listed    Latest Ref Rng & Units 06/26/2022    2:46 PM 12/13/2021    2:54 PM 11/16/2021    4:22 AM  CBC  WBC 4.0 - 10.5 K/uL 5.7  5.4  10.2   Hemoglobin 12.0 - 15.0 g/dL 09.8  11.9  14.7   Hematocrit 36.0 - 46.0 % 40.2  37.5  40.3   Platelets 150 - 400 K/uL 248  239  183        Latest Ref Rng & Units 06/26/2022    2:46 PM 12/13/2021    2:54 PM 11/16/2021    4:22 AM  CMP  Glucose 70 - 99 mg/dL 73  63  93   BUN 8 - 23 mg/dL 12  9  8    Creatinine 0.44 - 1.00 mg/dL 1.47  8.29  5.62   Sodium 135 - 145 mmol/L 141  142  140   Potassium 3.5 - 5.1 mmol/L 3.5  3.4  3.2   Chloride 98 - 111 mmol/L 103  105  103   CO2 22 - 32 mmol/L 30  31  23    Calcium 8.9 - 10.3 mg/dL 9.7  9.4  9.2   Total Protein 6.5 - 8.1 g/dL 7.7  7.5    Total Bilirubin 0.3 - 1.2 mg/dL 0.4  0.4    Alkaline Phos 38 - 126 U/L 64  69    AST 15 - 41 U/L 20  21    ALT 0 - 44 U/L 19  23       RADIOGRAPHIC STUDIES: No results found.  ASSESSMENT & PLAN ZAMYRA RISK 62 y.o. female with medical history significant for recurrent pulmonary emboli presents for a follow up visit.  A provoked venous thromboembolism (VTE) is one that has a clear inciting factor or event. Provoking factors include prolonged travel/immobility, surgery (particular abdominal or orthropedic), trauma,  and pregnancy/ estrogen containing birth control. After a detailed history and review of the records there is no clear provoking factor for this patient's VTE outside of being sedentary due to working form home. Due to recurrent episodes of pulmonary emboli, the recommendation is lifelong anticoagulation, as the cause may not be transient or reversible. We recommend 6 months or full strength anticoagulation with a re-evaluation after that time.  The patient's will then have a choice of maintenance dose DOAC (preferred, recommended), 81mg  ASA PO daily (non-preferred), or no further  anticoagulation (not recommended).    #Recurrent Pulmonary Emboli --will order baseline CMP and CBC to assure labs are adequate for DOAC therapy --hypercoagulable workup from 2014 was unremarkable --recommend the patient continue Xarelto 20 mg daily until she completes her current supply.  Then will transition to Xarelto 10 mg p.o. daily maintenance therapy. --patient denies any bleeding, bruising, or dark stools on this medication. It is well tolerated. No difficulties accessing/affording the medication --Labs today show white blood cell count 5.3, hemoglobin 13.8, MCV 90, and platelets of 258.  Creatinine was 0.73.  These were outside labs..  Additionally liver functions are within normal limits. --RTC in 6 months' time with strict return precautions for overt signs of bleeding.  No orders of the defined types were placed in this encounter.   All questions were answered. The patient knows to call the clinic with any problems, questions or concerns.  A total of more than 30 minutes were spent on this encounter with face-to-face time and non-face-to-face time, including preparing to see the patient, ordering tests and/or medications, counseling the patient and coordination of care as outlined above.   Ulysees Barns, MD Department of Hematology/Oncology [facility] at [facility] Phone: [phone] Pager: [phone] Email: [email or web address]  12/25/2022 4:10 PM

## 2023-01-21 DIAGNOSIS — R7309 Other abnormal glucose: Secondary | ICD-10-CM | POA: Diagnosis not present

## 2023-02-19 DIAGNOSIS — M5416 Radiculopathy, lumbar region: Secondary | ICD-10-CM | POA: Diagnosis not present

## 2023-02-20 DIAGNOSIS — R7309 Other abnormal glucose: Secondary | ICD-10-CM | POA: Diagnosis not present

## 2023-03-19 DIAGNOSIS — H5213 Myopia, bilateral: Secondary | ICD-10-CM | POA: Diagnosis not present

## 2023-03-19 DIAGNOSIS — H2513 Age-related nuclear cataract, bilateral: Secondary | ICD-10-CM | POA: Diagnosis not present

## 2023-03-19 DIAGNOSIS — H35413 Lattice degeneration of retina, bilateral: Secondary | ICD-10-CM | POA: Diagnosis not present

## 2023-03-19 DIAGNOSIS — H31003 Unspecified chorioretinal scars, bilateral: Secondary | ICD-10-CM | POA: Diagnosis not present

## 2023-03-19 DIAGNOSIS — H524 Presbyopia: Secondary | ICD-10-CM | POA: Diagnosis not present

## 2023-03-19 DIAGNOSIS — H25013 Cortical age-related cataract, bilateral: Secondary | ICD-10-CM | POA: Diagnosis not present

## 2023-03-23 DIAGNOSIS — R7309 Other abnormal glucose: Secondary | ICD-10-CM | POA: Diagnosis not present

## 2023-03-30 DIAGNOSIS — M4316 Spondylolisthesis, lumbar region: Secondary | ICD-10-CM | POA: Diagnosis not present

## 2023-03-30 DIAGNOSIS — Z6832 Body mass index (BMI) 32.0-32.9, adult: Secondary | ICD-10-CM | POA: Diagnosis not present

## 2023-03-30 DIAGNOSIS — M5416 Radiculopathy, lumbar region: Secondary | ICD-10-CM | POA: Diagnosis not present

## 2023-04-03 ENCOUNTER — Other Ambulatory Visit: Payer: Self-pay | Admitting: Hematology and Oncology

## 2023-04-07 DIAGNOSIS — Z1231 Encounter for screening mammogram for malignant neoplasm of breast: Secondary | ICD-10-CM | POA: Diagnosis not present

## 2023-04-22 DIAGNOSIS — R7309 Other abnormal glucose: Secondary | ICD-10-CM | POA: Diagnosis not present

## 2023-04-30 DIAGNOSIS — M4316 Spondylolisthesis, lumbar region: Secondary | ICD-10-CM | POA: Diagnosis not present

## 2023-05-09 DIAGNOSIS — M4316 Spondylolisthesis, lumbar region: Secondary | ICD-10-CM | POA: Diagnosis not present

## 2023-05-24 ENCOUNTER — Telehealth: Payer: Self-pay | Admitting: Hematology and Oncology

## 2023-05-24 NOTE — Telephone Encounter (Signed)
 Rescheduled appointments per provider on call. Patient is aware of the changes made and will be mailed an appointment reminder.

## 2023-06-05 DIAGNOSIS — G373 Acute transverse myelitis in demyelinating disease of central nervous system: Secondary | ICD-10-CM | POA: Diagnosis not present

## 2023-06-05 DIAGNOSIS — R32 Unspecified urinary incontinence: Secondary | ICD-10-CM | POA: Diagnosis not present

## 2023-06-08 DIAGNOSIS — M4316 Spondylolisthesis, lumbar region: Secondary | ICD-10-CM | POA: Diagnosis not present

## 2023-06-27 ENCOUNTER — Ambulatory Visit: Payer: BC Managed Care – PPO | Admitting: Hematology and Oncology

## 2023-07-06 ENCOUNTER — Inpatient Hospital Stay: Payer: BC Managed Care – PPO | Attending: Hematology and Oncology | Admitting: Hematology and Oncology

## 2023-07-06 ENCOUNTER — Inpatient Hospital Stay: Payer: BC Managed Care – PPO

## 2023-07-06 ENCOUNTER — Other Ambulatory Visit: Payer: Self-pay | Admitting: Hematology and Oncology

## 2023-07-06 VITALS — BP 119/71 | HR 76 | Temp 97.4°F | Resp 17 | Wt 201.2 lb

## 2023-07-06 DIAGNOSIS — R0781 Pleurodynia: Secondary | ICD-10-CM | POA: Insufficient documentation

## 2023-07-06 DIAGNOSIS — I2699 Other pulmonary embolism without acute cor pulmonale: Secondary | ICD-10-CM | POA: Diagnosis not present

## 2023-07-06 DIAGNOSIS — G373 Acute transverse myelitis in demyelinating disease of central nervous system: Secondary | ICD-10-CM | POA: Diagnosis not present

## 2023-07-06 DIAGNOSIS — M5030 Other cervical disc degeneration, unspecified cervical region: Secondary | ICD-10-CM | POA: Diagnosis not present

## 2023-07-06 DIAGNOSIS — M549 Dorsalgia, unspecified: Secondary | ICD-10-CM | POA: Diagnosis not present

## 2023-07-06 DIAGNOSIS — Z803 Family history of malignant neoplasm of breast: Secondary | ICD-10-CM | POA: Insufficient documentation

## 2023-07-06 DIAGNOSIS — Z79890 Hormone replacement therapy (postmenopausal): Secondary | ICD-10-CM | POA: Diagnosis not present

## 2023-07-06 DIAGNOSIS — Z7901 Long term (current) use of anticoagulants: Secondary | ICD-10-CM | POA: Diagnosis not present

## 2023-07-06 DIAGNOSIS — R059 Cough, unspecified: Secondary | ICD-10-CM | POA: Diagnosis not present

## 2023-07-06 DIAGNOSIS — Z86711 Personal history of pulmonary embolism: Secondary | ICD-10-CM | POA: Diagnosis not present

## 2023-07-06 DIAGNOSIS — R252 Cramp and spasm: Secondary | ICD-10-CM | POA: Diagnosis not present

## 2023-07-06 DIAGNOSIS — Z794 Long term (current) use of insulin: Secondary | ICD-10-CM | POA: Insufficient documentation

## 2023-07-06 LAB — CMP (CANCER CENTER ONLY)
ALT: 15 U/L (ref 0–44)
AST: 16 U/L (ref 15–41)
Albumin: 4.1 g/dL (ref 3.5–5.0)
Alkaline Phosphatase: 52 U/L (ref 38–126)
Anion gap: 4 — ABNORMAL LOW (ref 5–15)
BUN: 12 mg/dL (ref 8–23)
CO2: 34 mmol/L — ABNORMAL HIGH (ref 22–32)
Calcium: 9.6 mg/dL (ref 8.9–10.3)
Chloride: 105 mmol/L (ref 98–111)
Creatinine: 0.69 mg/dL (ref 0.44–1.00)
GFR, Estimated: >60 mL/min (ref >60)
Glucose, Bld: 105 mg/dL — ABNORMAL HIGH (ref 70–99)
Potassium: 3.9 mmol/L (ref 3.5–5.1)
Sodium: 143 mmol/L (ref 135–145)
Total Bilirubin: 0.4 mg/dL (ref 0.0–1.2)
Total Protein: 7.3 g/dL (ref 6.5–8.1)

## 2023-07-06 LAB — CBC WITH DIFFERENTIAL (CANCER CENTER ONLY)
Abs Immature Granulocytes: 0.01 10*3/uL (ref 0.00–0.07)
Basophils Absolute: 0 10*3/uL (ref 0.0–0.1)
Basophils Relative: 0 %
Eosinophils Absolute: 0 10*3/uL (ref 0.0–0.5)
Eosinophils Relative: 0 %
HCT: 38.5 % (ref 36.0–46.0)
Hemoglobin: 13.4 g/dL (ref 12.0–15.0)
Immature Granulocytes: 0 %
Lymphocytes Relative: 34 %
Lymphs Abs: 1.6 10*3/uL (ref 0.7–4.0)
MCH: 30.7 pg (ref 26.0–34.0)
MCHC: 34.8 g/dL (ref 30.0–36.0)
MCV: 88.3 fL (ref 80.0–100.0)
Monocytes Absolute: 0.5 10*3/uL (ref 0.1–1.0)
Monocytes Relative: 10 %
Neutro Abs: 2.5 10*3/uL (ref 1.7–7.7)
Neutrophils Relative %: 56 %
Platelet Count: 248 10*3/uL (ref 150–400)
RBC: 4.36 MIL/uL (ref 3.87–5.11)
RDW: 12.5 % (ref 11.5–15.5)
WBC Count: 4.6 10*3/uL (ref 4.0–10.5)
nRBC: 0 % (ref 0.0–0.2)

## 2023-07-06 MED ORDER — RIVAROXABAN 10 MG PO TABS: 10.0000 mg | ORAL_TABLET | Freq: Every day | ORAL | 2 refills | Status: AC

## 2023-07-06 NOTE — Progress Notes (Signed)
[facility] [phone]   [phone]  PROGRESS NOTE  Patient Care Team: Associates, [facility] as PCP - General (Rheumatology)  Hematological/Oncological History # Recurrent Pulmonary Emboli  1) 07/31/2012: Presented with acute chest pain. CT scan showed acute pulmonary embolism within the right lower lobe segmental branch.  She was discharged home on warfarin.  Underwent hypercoagulable work-up that was unremarkable.   2) 09/11/2012: Establish care with hematology/oncology with Dr. Clelia Croft on 09/11/2012.  Felt that PE was unprovoked and recommended duration of anticoagulation for 6 months and then addition to low-dose aspirin 81 mg.   3) 11/15/2021-11/16/2021: Presented with cough, congestion and left pleuritic chest pain.  CT angiogram showed small acute pulmonary embolus involving the segmental vessels at the lingula and left upper lobe.  There was no evidence of DVTs.  Patient was discharged on Xarelto.  4) 12/13/2021: establish care with Dr. Leonides Schanz   Interval History:  Cynthia Villegas 63 y.o. female with medical history significant for recurrent pulmonary emboli presents for a follow up visit. The patient's last visit was on 12/25/2022 at which time she established care. In the interim since the last visit she has continued on Xarelto therapy without difficulty.  On exam today Cynthia Villegas notes she has been well overall interim since her last visit.  She has been faithfully taking her Xarelto 10 mg p.o. daily with no issues of bleeding, bruising, or dark stools.  She reports does occasionally get some leg cramps no signs or symptoms concerning for recurrent VTE such as chest pain, leg swelling, leg pain.  She reports that she is not having any issues with lightheadedness or dizziness and has no upcoming procedures to be done.  She reports her Xarelto is costing currently $30 per 90-day supply.  Overall she feels well and is willing and able to proceed  with Xarelto therapy at this time..  She also denies any fevers, chills, sweats.  A full 10 point ROS was otherwise negative.  MEDICAL HISTORY:  Past Medical History:  Diagnosis Date   Back pain    DDD (degenerative disc disease), cervical    Graves disease    History of blood clots 2014   Lumbar stenosis    Transverse myelitis (HCC)     SURGICAL HISTORY: Past Surgical History:  Procedure Laterality Date   OTHER SURGICAL HISTORY     Hysterectomy    SOCIAL HISTORY: Social History   Socioeconomic History   Marital status: Single    Spouse name: Not on file   Number of children: 1   Years of education: 12   Highest education level: Not on file  Occupational History   Not on file  Tobacco Use   Smoking status: Never   Smokeless tobacco: Never  Substance and Sexual Activity   Alcohol use: Yes    Comment: occasional   Drug use: No   Sexual activity: Not on file  Other Topics Concern   Not on file  Social History Narrative   Lives home alone.  Has one child.  Working from home now, but normally works in office setting.  Education HS grad.     Social Drivers of Corporate investment banker Strain: Not on file  Food Insecurity: Not on file  Transportation Needs: Not on file  Physical Activity: Not on file  Stress: Not on file  Social Connections: Not on file  Intimate Partner Violence: Not on file    FAMILY HISTORY: Family History  Problem Relation  Age of Onset   Diabetes Mother    Breast cancer Mother    Dementia Mother    Heart disease Father     ALLERGIES:  has no known allergies.  MEDICATIONS:  Current Outpatient Medications  Medication Sig Dispense Refill   acetaminophen (TYLENOL) 500 MG tablet Take 1,000 mg by mouth every 8 (eight) hours as needed.     Biotin w/ Vitamins C & E (HAIR/SKIN/NAILS PO) Take 1 Dose by mouth daily. Vitafusion Hair, Skin and Nails gummy vitamin.     levothyroxine (SYNTHROID, LEVOTHROID) 100 MCG tablet Take 100 mcg by mouth  daily before breakfast.     Multiple Vitamins-Minerals (EYE VITAMINS) CAPS Take 1 capsule by mouth daily. Focus Select AREDS 2     XARELTO 10 MG TABS tablet TAKE 1 TABLET BY MOUTH EVERY DAY 90 tablet 2   No current facility-administered medications for this visit.    REVIEW OF SYSTEMS:   Constitutional: ( - ) fevers, ( - )  chills , ( - ) night sweats Eyes: ( - ) blurriness of vision, ( - ) double vision, ( - ) watery eyes Ears, nose, mouth, throat, and face: ( - ) mucositis, ( - ) sore throat Respiratory: ( - ) cough, ( - ) dyspnea, ( - ) wheezes Cardiovascular: ( - ) palpitation, ( - ) chest discomfort, ( - ) lower extremity swelling Gastrointestinal:  ( - ) nausea, ( - ) heartburn, ( - ) change in bowel habits Skin: ( - ) abnormal skin rashes Lymphatics: ( - ) new lymphadenopathy, ( - ) easy bruising Neurological: ( - ) numbness, ( - ) tingling, ( - ) new weaknesses Behavioral/Psych: ( - ) mood change, ( - ) new changes  All other systems were reviewed with the patient and are negative.  PHYSICAL EXAMINATION:  Vitals:   07/06/23 1151  BP: 119/71  Pulse: 76  Resp: 17  Temp: (!) 97.4 F (36.3 C)  SpO2: 100%     Filed Weights   07/06/23 1151  Weight: 201 lb 3 oz (91.3 kg)      GENERAL: Well-appearing middle-aged African-American female, alert, no distress and comfortable SKIN: skin color, texture, turgor are normal, no rashes or significant lesions EYES: conjunctiva are pink and non-injected, sclera clear LUNGS: clear to auscultation and percussion with normal breathing effort HEART: regular rate & rhythm and no murmurs and no lower extremity edema Musculoskeletal: no cyanosis of digits and no clubbing  PSYCH: alert & oriented x 3, fluent speech NEURO: no focal motor/sensory deficits  LABORATORY DATA:  I have reviewed the data as listed    Latest Ref Rng & Units 07/06/2023   11:13 AM 06/26/2022    2:46 PM 12/13/2021    2:54 PM  CBC  WBC 4.0 - 10.5 K/uL 4.6  5.7   5.4   Hemoglobin 12.0 - 15.0 g/dL 28.4  13.2  44.0   Hematocrit 36.0 - 46.0 % 38.5  40.2  37.5   Platelets 150 - 400 K/uL 248  248  239        Latest Ref Rng & Units 07/06/2023   11:13 AM 06/26/2022    2:46 PM 12/13/2021    2:54 PM  CMP  Glucose 70 - 99 mg/dL 102  73  63   BUN 8 - 23 mg/dL 12  12  9    Creatinine 0.44 - 1.00 mg/dL 7.25  3.66  4.40   Sodium 135 - 145 mmol/L 143  141  142   Potassium 3.5 - 5.1 mmol/L 3.9  3.5  3.4   Chloride 98 - 111 mmol/L 105  103  105   CO2 22 - 32 mmol/L 34  30  31   Calcium 8.9 - 10.3 mg/dL 9.6  9.7  9.4   Total Protein 6.5 - 8.1 g/dL 7.3  7.7  7.5   Total Bilirubin 0.0 - 1.2 mg/dL 0.4  0.4  0.4   Alkaline Phos 38 - 126 U/L 52  64  69   AST 15 - 41 U/L 16  20  21    ALT 0 - 44 U/L 15  19  23       RADIOGRAPHIC STUDIES: No results found.  ASSESSMENT & PLAN Cynthia Villegas 63 y.o. female with medical history significant for recurrent pulmonary emboli presents for a follow up visit.  A provoked venous thromboembolism (VTE) is one that has a clear inciting factor or event. Provoking factors include prolonged travel/immobility, surgery (particular abdominal or orthropedic), trauma,  and pregnancy/ estrogen containing birth control. After a detailed history and review of the records there is no clear provoking factor for this patient's VTE outside of being sedentary due to working form home. Due to recurrent episodes of pulmonary emboli, the recommendation is lifelong anticoagulation, as the cause may not be transient or reversible. We recommend 6 months or full strength anticoagulation with a re-evaluation after that time.  The patient's will then have a choice of maintenance dose DOAC (preferred, recommended), 81mg  ASA PO daily (non-preferred), or no further anticoagulation (not recommended).    #Recurrent Pulmonary Emboli --will order baseline CMP and CBC to assure labs are adequate for DOAC therapy --hypercoagulable workup from 2014 was  unremarkable --recommend the patient continue Xarelto 20 mg daily until she completes her current supply.  Then will transition to Xarelto 10 mg p.o. daily maintenance therapy. --patient denies any bleeding, bruising, or dark stools on this medication. It is well tolerated. No difficulties accessing/affording the medication --Labs today show white blood cell count 4.6, hemoglobin 13.4, MCV 88.3, platelets 248.  Creatinine was 0.69.  These were outside labs..  Additionally liver functions are within normal limits. --RTC in 6 months' time with strict return precautions for overt signs of bleeding.  No orders of the defined types were placed in this encounter.   All questions were answered. The patient knows to call the clinic with any problems, questions or concerns.  A total of more than 30 minutes were spent on this encounter with face-to-face time and non-face-to-face time, including preparing to see the patient, ordering tests and/or medications, counseling the patient and coordination of care as outlined above.   Ulysees Barns, MD Department of Hematology/Oncology [facility] at [facility] Phone: [phone] Pager: [phone] Email: [email or web address]  07/06/2023 12:38 PM

## 2023-08-21 DIAGNOSIS — M13862 Other specified arthritis, left knee: Secondary | ICD-10-CM | POA: Diagnosis not present

## 2023-08-21 DIAGNOSIS — M13861 Other specified arthritis, right knee: Secondary | ICD-10-CM | POA: Diagnosis not present

## 2023-08-22 ENCOUNTER — Telehealth: Payer: Self-pay | Admitting: *Deleted

## 2023-08-22 NOTE — Telephone Encounter (Signed)
 Received call from pt. She is asking about appropriate medications for her arthritis. She is on Xarelto for blood clots. Advised she should not take any of the NSAIDs as they can affect platelet function and increase the risk of bleeding even more. Advised that she should only take ES Tylenol or use topicals such as BioFreeze, lidocaine patches. Advised that if her pain is unbearable to consult with her PCP for perhaps, Tramadol as that is not an antiinflammatory but a less strong narcotic. Pt voiced understanding.

## 2023-08-23 DIAGNOSIS — T753XXA Motion sickness, initial encounter: Secondary | ICD-10-CM | POA: Diagnosis not present

## 2023-08-23 DIAGNOSIS — L309 Dermatitis, unspecified: Secondary | ICD-10-CM | POA: Diagnosis not present

## 2023-12-10 DIAGNOSIS — E559 Vitamin D deficiency, unspecified: Secondary | ICD-10-CM | POA: Diagnosis not present

## 2023-12-10 DIAGNOSIS — E890 Postprocedural hypothyroidism: Secondary | ICD-10-CM | POA: Diagnosis not present

## 2023-12-10 DIAGNOSIS — I10 Essential (primary) hypertension: Secondary | ICD-10-CM | POA: Diagnosis not present

## 2023-12-10 DIAGNOSIS — Z79899 Other long term (current) drug therapy: Secondary | ICD-10-CM | POA: Diagnosis not present

## 2023-12-17 DIAGNOSIS — Z23 Encounter for immunization: Secondary | ICD-10-CM | POA: Diagnosis not present

## 2023-12-17 DIAGNOSIS — Z7901 Long term (current) use of anticoagulants: Secondary | ICD-10-CM | POA: Diagnosis not present

## 2023-12-17 DIAGNOSIS — E559 Vitamin D deficiency, unspecified: Secondary | ICD-10-CM | POA: Diagnosis not present

## 2023-12-17 DIAGNOSIS — E890 Postprocedural hypothyroidism: Secondary | ICD-10-CM | POA: Diagnosis not present

## 2023-12-17 DIAGNOSIS — I10 Essential (primary) hypertension: Secondary | ICD-10-CM | POA: Diagnosis not present

## 2023-12-17 DIAGNOSIS — M79671 Pain in right foot: Secondary | ICD-10-CM | POA: Diagnosis not present

## 2023-12-17 DIAGNOSIS — R7309 Other abnormal glucose: Secondary | ICD-10-CM | POA: Diagnosis not present

## 2023-12-17 DIAGNOSIS — R32 Unspecified urinary incontinence: Secondary | ICD-10-CM | POA: Diagnosis not present

## 2023-12-17 DIAGNOSIS — M5030 Other cervical disc degeneration, unspecified cervical region: Secondary | ICD-10-CM | POA: Diagnosis not present

## 2023-12-17 DIAGNOSIS — M5126 Other intervertebral disc displacement, lumbar region: Secondary | ICD-10-CM | POA: Diagnosis not present

## 2023-12-17 DIAGNOSIS — Z0000 Encounter for general adult medical examination without abnormal findings: Secondary | ICD-10-CM | POA: Diagnosis not present

## 2023-12-17 DIAGNOSIS — G373 Acute transverse myelitis in demyelinating disease of central nervous system: Secondary | ICD-10-CM | POA: Diagnosis not present

## 2023-12-31 ENCOUNTER — Ambulatory Visit: Admitting: Podiatry

## 2024-01-02 ENCOUNTER — Telehealth: Payer: Self-pay | Admitting: *Deleted

## 2024-01-02 NOTE — Telephone Encounter (Signed)
 Patient's PCP sent 12/10/23 to [facility]. Patient called and stated she was told labs less than a month old were acceptable and asked to cancel labs scheduled prior to appt with Ms. I.Thayil, PA 01/04/24 since her PCP had sent them. Labs reviewed and per Ms. Thayil, patient does not need labs on 01/04/24.  Contacted patient to inform that lab appt can be cancelled. Patient stated sudden death in family and funeral Friday 01/04/24. She asked to r/s appt with Ms. Thayil for Wednesday or Thursday. No appts available for Ms Thayil until Wednesday 8/20 - appt r/s for 01/09/24. Patient verbalized understanding.

## 2024-01-04 ENCOUNTER — Inpatient Hospital Stay: Payer: BC Managed Care – PPO | Admitting: Physician Assistant

## 2024-01-04 ENCOUNTER — Inpatient Hospital Stay: Payer: BC Managed Care – PPO

## 2024-01-07 ENCOUNTER — Ambulatory Visit (INDEPENDENT_AMBULATORY_CARE_PROVIDER_SITE_OTHER)

## 2024-01-07 ENCOUNTER — Ambulatory Visit: Admitting: Podiatry

## 2024-01-07 ENCOUNTER — Encounter: Payer: Self-pay | Admitting: Podiatry

## 2024-01-07 DIAGNOSIS — M722 Plantar fascial fibromatosis: Secondary | ICD-10-CM | POA: Diagnosis not present

## 2024-01-07 MED ORDER — METHYLPREDNISOLONE 4 MG PO TBPK: ORAL_TABLET | ORAL | 0 refills | Status: AC

## 2024-01-07 NOTE — Patient Instructions (Signed)

## 2024-01-07 NOTE — Progress Notes (Signed)
  Subjective:  Patient ID: Cynthia Villegas, female    DOB: 1961-03-13,   MRN: [id number]  Chief Complaint  Patient presents with   Foot Pain    My feet hurt mostly in my heels, especially when I get up in the morning.    63 y.o. female presents for concern of bilateral foot pain that has been ongoing for months   . Relates first steps in the morning are most painful and does get better as the days goes on. She considered getting Goodfeet inserts. She has not been taking any anti-inflammatories. Unable to take NSAIDS.   Denies any other pedal complaints. Denies n/v/f/c.   Past Medical History:  Diagnosis Date   Back pain    DDD (degenerative disc disease), cervical    Graves disease    History of blood clots 2014   Lumbar stenosis    Transverse myelitis (HCC)     Objective:  Physical Exam: Vascular: DP/PT pulses 2/4 bilateral. CFT <3 seconds. Normal hair growth on digits. No edema.  Skin. No lacerations or abrasions bilateral feet.  Musculoskeletal: MMT 5/5 bilateral lower extremities in DF, PF, Inversion and Eversion. Deceased ROM in DF of ankle joint. Tender to the medial calcaneal tubercle bilaterally . No pain with achilles, PT or arch. No pain with calcaneal squeeze.  Neurological: Sensation intact to light touch.   Assessment:   1. Plantar fasciitis, bilateral      Plan:  Patient was evaluated and treated and all questions answered. Discussed plantar fasciitis with patient.  X-rays reviewed and discussed with patient. No acute fractures or dislocations noted. Mild spurring noted at inferior calcaneus. Degenerative changes noted in the midfoot.  Discussed treatment options including, ice, NSAIDS, supportive shoes, bracing, and stretching. Stretching exercises provided to be done on a daily basis.   Prescription for medrol  dose pack  provided and sent to pharmacy. PR brace dispensed bilateral. Follow-up 6 weeks or sooner if any problems arise. In the meantime, encouraged  to call the office with any questions, concerns, change in symptoms.      Asberry Failing, DPM

## 2024-01-09 ENCOUNTER — Inpatient Hospital Stay: Attending: Physician Assistant | Admitting: Physician Assistant

## 2024-01-09 VITALS — BP 124/75 | HR 80 | Temp 97.3°F | Resp 18 | Wt 207.0 lb

## 2024-01-09 DIAGNOSIS — Z86711 Personal history of pulmonary embolism: Secondary | ICD-10-CM | POA: Diagnosis not present

## 2024-01-09 DIAGNOSIS — R079 Chest pain, unspecified: Secondary | ICD-10-CM | POA: Insufficient documentation

## 2024-01-09 DIAGNOSIS — Z79890 Hormone replacement therapy (postmenopausal): Secondary | ICD-10-CM | POA: Insufficient documentation

## 2024-01-09 DIAGNOSIS — M48061 Spinal stenosis, lumbar region without neurogenic claudication: Secondary | ICD-10-CM | POA: Diagnosis not present

## 2024-01-09 DIAGNOSIS — E0500 Thyrotoxicosis with diffuse goiter without thyrotoxic crisis or storm: Secondary | ICD-10-CM | POA: Insufficient documentation

## 2024-01-09 DIAGNOSIS — G373 Acute transverse myelitis in demyelinating disease of central nervous system: Secondary | ICD-10-CM | POA: Diagnosis not present

## 2024-01-09 DIAGNOSIS — Z7901 Long term (current) use of anticoagulants: Secondary | ICD-10-CM | POA: Insufficient documentation

## 2024-01-09 DIAGNOSIS — M5030 Other cervical disc degeneration, unspecified cervical region: Secondary | ICD-10-CM | POA: Diagnosis not present

## 2024-01-09 DIAGNOSIS — Z803 Family history of malignant neoplasm of breast: Secondary | ICD-10-CM | POA: Diagnosis not present

## 2024-01-09 DIAGNOSIS — Z79899 Other long term (current) drug therapy: Secondary | ICD-10-CM | POA: Diagnosis not present

## 2024-01-09 NOTE — Progress Notes (Signed)
 [facility] [phone]   [phone]  PROGRESS NOTE  Patient Care Team: Gerome Brunet, DO as PCP - General (Family Medicine)  Hematological/Oncological History # Recurrent Pulmonary Emboli  1) 07/31/2012: Presented with acute chest pain. CT scan showed acute pulmonary embolism within the right lower lobe segmental branch.  She was discharged home on warfarin.  Underwent hypercoagulable work-up that was unremarkable.   2) 09/11/2012: Establish care with hematology/oncology with Dr. Amadeo on 09/11/2012.  Felt that PE was unprovoked and recommended duration of anticoagulation for 6 months and then addition to low-dose aspirin 81 mg.   3) 11/15/2021-11/16/2021: Presented with cough, congestion and left pleuritic chest pain.  CT angiogram showed small acute pulmonary embolus involving the segmental vessels at the lingula and left upper lobe.  There was no evidence of DVTs.  Patient was discharged on Xarelto .  4) 12/13/2021: establish care with Dr. Federico   Interval History:  Cynthia Villegas 63 y.o. female with medical history significant for recurrent pulmonary emboli presents for a follow up visit. The patient's last visit was on 07/06/2023. In the interim, she has continued on Xarelto  therapy without difficulty.  On exam today Cynthia Villegas notes she has been well overall interim since her last visit.  She is compliant with taking her Xarelto  as prescribed. She denies any easy bruising or signs of bleeding. She denies any signs or symptoms of recurrent VTE such as chest pain, leg swelling, leg pain. Overall she feels well and is willing and able to proceed with Xarelto  therapy at this time. She also denies any fevers, chills, sweats, nausea, vomiting or bowel habit changes.  A full 10 point ROS was otherwise negative.  MEDICAL HISTORY:  Past Medical History:  Diagnosis Date   Back pain    DDD (degenerative disc disease), cervical    Graves disease    History of  blood clots 2014   Lumbar stenosis    Transverse myelitis (HCC)     SURGICAL HISTORY: Past Surgical History:  Procedure Laterality Date   OTHER SURGICAL HISTORY     Hysterectomy    SOCIAL HISTORY: Social History   Socioeconomic History   Marital status: Single    Spouse name: Not on file   Number of children: 1   Years of education: 12   Highest education level: Not on file  Occupational History   Not on file  Tobacco Use   Smoking status: Never   Smokeless tobacco: Never  Substance and Sexual Activity   Alcohol use: Yes    Comment: occasional   Drug use: No   Sexual activity: Not on file  Other Topics Concern   Not on file  Social History Narrative   Lives home alone.  Has one child.  Working from home now, but normally works in office setting.  Education HS grad.     Social Drivers of Corporate investment banker Strain: Not on file  Food Insecurity: Not on file  Transportation Needs: Not on file  Physical Activity: Not on file  Stress: Not on file  Social Connections: Not on file  Intimate Partner Violence: Not on file    FAMILY HISTORY: Family History  Problem Relation Age of Onset   Diabetes Mother    Breast cancer Mother    Dementia Mother    Heart disease Father     ALLERGIES:  has no known allergies.  MEDICATIONS:  Current Outpatient Medications  Medication Sig Dispense Refill   acetaminophen  (TYLENOL )  500 MG tablet Take 1,000 mg by mouth every 8 (eight) hours as needed.     amLODipine (NORVASC) 2.5 MG tablet TAKE 1 TABLET BY MOUTH EVERY DAY FOR BLOOD PRESSURE     Biotin w/ Vitamins C & E (HAIR/SKIN/NAILS PO) Take 1 Dose by mouth daily. Vitafusion Hair, Skin and Nails gummy vitamin.     levothyroxine  (SYNTHROID , LEVOTHROID) 100 MCG tablet Take 100 mcg by mouth daily before breakfast.     methylPREDNISolone  (MEDROL  DOSEPAK) 4 MG TBPK tablet Take as directed 21 tablet 0   Multiple Vitamins-Minerals (CENTRUM SILVER WOMEN 50+ PO) Take by mouth.      Multiple Vitamins-Minerals (PRESERVISION AREDS 2) CAPS Take by mouth.     rivaroxaban  (XARELTO ) 10 MG TABS tablet Take 1 tablet (10 mg total) by mouth daily. 90 tablet 2   No current facility-administered medications for this visit.    REVIEW OF SYSTEMS:   Constitutional: ( - ) fevers, ( - )  chills , ( - ) night sweats Eyes: ( - ) blurriness of vision, ( - ) double vision, ( - ) watery eyes Ears, nose, mouth, throat, and face: ( - ) mucositis, ( - ) sore throat Respiratory: ( - ) cough, ( - ) dyspnea, ( - ) wheezes Cardiovascular: ( - ) palpitation, ( - ) chest discomfort, ( - ) lower extremity swelling Gastrointestinal:  ( - ) nausea, ( - ) heartburn, ( - ) change in bowel habits Skin: ( - ) abnormal skin rashes Lymphatics: ( - ) new lymphadenopathy, ( - ) easy bruising Neurological: ( - ) numbness, ( - ) tingling, ( - ) new weaknesses Behavioral/Psych: ( - ) mood change, ( - ) new changes  All other systems were reviewed with the patient and are negative.  PHYSICAL EXAMINATION:  Vitals:   01/09/24 0851  BP: 124/75  Pulse: 80  Resp: 18  Temp: (!) 97.3 F (36.3 C)  SpO2: 99%     Filed Weights   01/09/24 0851  Weight: 207 lb (93.9 kg)      GENERAL: Well-appearing middle-aged African-American female, alert, no distress and comfortable SKIN: skin color, texture, turgor are normal, no rashes or significant lesions EYES: conjunctiva are pink and non-injected, sclera clear LUNGS: clear to auscultation and percussion with normal breathing effort HEART: regular rate & rhythm and no murmurs and no lower extremity edema Musculoskeletal: no cyanosis of digits and no clubbing  PSYCH: alert & oriented x 3, fluent speech NEURO: no focal motor/sensory deficits  LABORATORY DATA:  I have reviewed the data as listed    Latest Ref Rng & Units 07/06/2023   11:13 AM 06/26/2022    2:46 PM 12/13/2021    2:54 PM  CBC  WBC 4.0 - 10.5 K/uL 4.6  5.7  5.4   Hemoglobin 12.0 - 15.0 g/dL  86.5  85.8  87.0   Hematocrit 36.0 - 46.0 % 38.5  40.2  37.5   Platelets 150 - 400 K/uL 248  248  239        Latest Ref Rng & Units 07/06/2023   11:13 AM 06/26/2022    2:46 PM 12/13/2021    2:54 PM  CMP  Glucose 70 - 99 mg/dL 894  73  63   BUN 8 - 23 mg/dL 12  12  9    Creatinine 0.44 - 1.00 mg/dL 9.30  9.37  9.27   Sodium 135 - 145 mmol/L 143  141  142   Potassium 3.5 -  5.1 mmol/L 3.9  3.5  3.4   Chloride 98 - 111 mmol/L 105  103  105   CO2 22 - 32 mmol/L 34  30  31   Calcium 8.9 - 10.3 mg/dL 9.6  9.7  9.4   Total Protein 6.5 - 8.1 g/dL 7.3  7.7  7.5   Total Bilirubin 0.0 - 1.2 mg/dL 0.4  0.4  0.4   Alkaline Phos 38 - 126 U/L 52  64  69   AST 15 - 41 U/L 16  20  21    ALT 0 - 44 U/L 15  19  23       RADIOGRAPHIC STUDIES: DG Foot Complete Right Result Date: 01/07/2024 Please see detailed radiograph report in office note.  DG Foot Complete Left Result Date: 01/07/2024 Please see detailed radiograph report in office note.   ASSESSMENT & PLAN Cynthia Villegas is a 63 y.o.  female with medical history significant for recurrent pulmonary emboli presents for a follow up visit.   #Recurrent Pulmonary Emboli --hypercoagulable workup from 2014 was unremarkable --Outside labs from 12/11/2023 showed WBC 4.9, Hgb 13.9, Plt 228, creatinine and LFTs are in range.  --recommend the patient continue Xarelto  10 mg p.o. daily maintenance therapy. --patient denies any bleeding, bruising, or dark stools on this medication. It is well tolerated. No difficulties accessing/affording the medication --RTC in 6 months' time with strict return precautions for overt signs of bleeding.  No orders of the defined types were placed in this encounter.   All questions were answered. The patient knows to call the clinic with any problems, questions or concerns.   I have spent a total of 25 minutes minutes of face-to-face and non-face-to-face time, preparing to see the patient, performing a medically  appropriate examination, counseling and educating the patient, documenting clinical information in the electronic health record, independently interpreting results and communicating results to the patient, and care coordination.   Johnston Police PA-C Dept of Hematology and Oncology [facility] Phone: [phone]   01/09/2024 4:18 PM

## 2024-01-15 ENCOUNTER — Telehealth: Payer: Self-pay | Admitting: *Deleted

## 2024-01-15 DIAGNOSIS — Z1211 Encounter for screening for malignant neoplasm of colon: Secondary | ICD-10-CM | POA: Diagnosis not present

## 2024-01-15 NOTE — Telephone Encounter (Signed)
 error

## 2024-02-15 DIAGNOSIS — D124 Benign neoplasm of descending colon: Secondary | ICD-10-CM | POA: Diagnosis not present

## 2024-02-15 DIAGNOSIS — K635 Polyp of colon: Secondary | ICD-10-CM | POA: Diagnosis not present

## 2024-02-15 DIAGNOSIS — Z1211 Encounter for screening for malignant neoplasm of colon: Secondary | ICD-10-CM | POA: Diagnosis not present

## 2024-02-18 ENCOUNTER — Ambulatory Visit: Admitting: Podiatry

## 2024-02-18 ENCOUNTER — Encounter: Payer: Self-pay | Admitting: Podiatry

## 2024-02-18 DIAGNOSIS — M722 Plantar fascial fibromatosis: Secondary | ICD-10-CM | POA: Diagnosis not present

## 2024-02-18 NOTE — Progress Notes (Signed)
  Subjective:  Patient ID: Cynthia Villegas, female    DOB: 25-Oct-1960,   MRN: [id number]  Chief Complaint  Patient presents with   Plantar Fasciitis    They definitely did better when I was taking the Cortisone.  They're probably back where they were before.  The braces help.  I think I need something to put in my shoes.    63 y.o. female presents for follow-up of bilateral plantar fasciitis. Relates doing a little better but not quite 50%. She has been stretching and wearing the braces.  Unable to take NSAIDS.   Denies any other pedal complaints. Denies n/v/f/c.   Past Medical History:  Diagnosis Date   Back pain    DDD (degenerative disc disease), cervical    Graves disease    History of blood clots 2014   Lumbar stenosis    Transverse myelitis (HCC)     Objective:  Physical Exam: Vascular: DP/PT pulses 2/4 bilateral. CFT <3 seconds. Normal hair growth on digits. No edema.  Skin. No lacerations or abrasions bilateral feet.  Musculoskeletal: MMT 5/5 bilateral lower extremities in DF, PF, Inversion and Eversion. Deceased ROM in DF of ankle joint. Tender to the medial calcaneal tubercle bilaterally . No pain with achilles, PT or arch. No pain with calcaneal squeeze.  Neurological: Sensation intact to light touch.   Assessment:   1. Plantar fasciitis, bilateral       Plan:  Patient was evaluated and treated and all questions answered. Discussed plantar fasciitis with patient.  X-rays reviewed and discussed with patient. No acute fractures or dislocations noted. Mild spurring noted at inferior calcaneus. Degenerative changes noted in the midfoot.  Discussed treatment options including, ice, NSAIDS, supportive shoes, bracing, and stretching.  Continue stretching and PT order placed  Continue brace and will consider CMO  Follow-up 8  weeks or sooner if any problems arise. In the meantime, encouraged to call the office with any questions, concerns, change in symptoms.       Asberry Failing, DPM

## 2024-02-19 DIAGNOSIS — D124 Benign neoplasm of descending colon: Secondary | ICD-10-CM | POA: Diagnosis not present

## 2024-02-19 DIAGNOSIS — K635 Polyp of colon: Secondary | ICD-10-CM | POA: Diagnosis not present

## 2024-02-29 ENCOUNTER — Other Ambulatory Visit: Payer: Self-pay

## 2024-02-29 ENCOUNTER — Ambulatory Visit: Attending: Family Medicine

## 2024-02-29 DIAGNOSIS — M79671 Pain in right foot: Secondary | ICD-10-CM | POA: Diagnosis not present

## 2024-02-29 DIAGNOSIS — M722 Plantar fascial fibromatosis: Secondary | ICD-10-CM | POA: Insufficient documentation

## 2024-02-29 DIAGNOSIS — R6889 Other general symptoms and signs: Secondary | ICD-10-CM | POA: Diagnosis not present

## 2024-02-29 DIAGNOSIS — M79672 Pain in left foot: Secondary | ICD-10-CM | POA: Diagnosis not present

## 2024-02-29 NOTE — Therapy (Signed)
 OUTPATIENT PHYSICAL THERAPY LOWER EXTREMITY EVALUATION   Patient Name: Cynthia Villegas MRN: [id number] DOB:1961/05/16, 63 y.o., female Today's Date: 02/29/2024  END OF SESSION:  PT End of Session - 02/29/24 1030     Visit Number 1    Number of Visits 16    Date for Recertification  04/30/24    Authorization Type [employer]    PT Start Time 0930    PT Stop Time 1015    PT Time Calculation (min) 45 min    Activity Tolerance Patient tolerated treatment well          Past Medical History:  Diagnosis Date   Back pain    DDD (degenerative disc disease), cervical    Graves disease    History of blood clots 2014   Lumbar stenosis    Transverse myelitis (HCC)    Past Surgical History:  Procedure Laterality Date   OTHER SURGICAL HISTORY     Hysterectomy   Patient Active Problem List   Diagnosis Date Noted   Cellulitis 11/16/2021   Pulmonary embolism (HCC) 11/15/2021   Chest pain 08/04/2012   Pulmonary embolism (HCC) 08/01/2012   Hypokalemia 08/01/2012   Hypothyroidism 08/01/2012   Anemia 08/01/2012    PCP: Gerome Brunet, DO  REFERRING PROVIDER: Joya Stabs, DPM  REFERRING DIAG: M72.2 (ICD-10-CM) - Plantar fasciitis, bilateral  THERAPY DIAG:  Pain in left foot  Pain in right foot  Decreased activity tolerance  Rationale for Evaluation and Treatment: Rehabilitation  ONSET DATE: 5 months  SUBJECTIVE:   SUBJECTIVE STATEMENT: Pain started about 5 months ago in both my feet, no idea how. 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 Yes: NPRS scale: 2B, 4C, 8W Pain location: BL arches Pain description: sharp pain, worse in morn Aggravating factors: inactivity Relieving factors: medicine, moving it  PRECAUTIONS: None  RED FLAGS: None   WEIGHT BEARING RESTRICTIONS: No  FALLS:  Has patient fallen in last 6 months? No    PLOF: Independent  PATIENT  GOALS:  decrease pain, be able to walk more   OBJECTIVE:  Note: Objective measures were completed at Evaluation unless otherwise noted.    PATIENT SURVEYS:  LEFS  Extreme difficulty/unable (0), Quite a bit of difficulty (1), Moderate difficulty (2), Little difficulty (3), No difficulty (4) Survey date:    Any of your usual work, housework or school activities   2. Usual hobbies, recreational or sporting activities   3. Getting into/out of the bath   4. Walking between rooms   5. Putting on socks/shoes   6. Squatting    7. Lifting an object, like a bag of groceries from the floor   8. Performing light activities around your home   9. Performing heavy activities around your home   10. Getting into/out of a car   11. Walking 2 blocks   12. Walking 1 mile   13. Going up/down 10 stairs (1 flight)   14. Standing for 1 hour   15.  sitting for 1 hour   16. Running on even ground   17. Running on uneven ground   18. Making sharp turns while running fast   19. Hopping    20. Rolling over in bed   Score total:  26       POSTURE: rounded shoulders and forward head  PALPATION: TTP BL sole of feet and L dorsum (more pain on left  side)  LOWER EXTREMITY ROM:  Active ROM Right eval Left eval  Hip flexion    Hip extension    Hip abduction    Hip adduction    Hip internal rotation    Hip external rotation    Knee flexion    Knee extension    Ankle dorsiflexion 0 4  Ankle plantarflexion 45 50  Ankle inversion    Ankle eversion     (Blank rows = not tested)  LOWER EXTREMITY MMT:  MMT Right eval Left eval  Hip flexion 4- 4-  Hip extension    Hip abduction    Hip adduction    Hip internal rotation    Hip external rotation    Knee flexion 4- 4-  Knee extension 4 4  Ankle dorsiflexion 4- (p! Dorsum) 4-  Ankle plantarflexion 4 4  Ankle inversion    Ankle eversion     (Blank rows = not tested)  LOWER EXTREMITY SPECIAL TESTS:  +windlass L side (R side also  presenting w/ sx of plantar fascitis)                                                                                                                                  TREATMENT DATE: 10/10  TREATMENT 02/29/2024:  Therapeutic Exercise: -tennis ball roll 2x30s BL -towel scrunches 2x8x3s -red tb PF w/ inversion 2x8x3s -educated patient about HEP, prognosis, POC, and relevant tissues and anatomy   Manual Therapy: - STM bilateral soles/arches of feet, and left dorsum of foot          PATIENT EDUCATION:  Education details: HEP Person educated: Patient Education method: Programmer, multimedia, Facilities manager, Verbal cues, and Handouts Education comprehension: verbalized understanding and returned demonstration  HOME EXERCISE PROGRAM: Access Code: [email or web address] URL: [email or web address] Date: 02/29/2024 Prepared by: Washington Scot  Exercises - Seated Toe Towel Scrunches  - 2 x daily - 5 x weekly - 2 sets - 8 reps - 3 hold - Long Sitting Ankle Plantar Flexion with Resistance  - 2 x daily - 5 x weekly - 3 sets - 8 reps - 3 hold -tennis ball roll 2x30s 5x/wk 2-3x/day  ASSESSMENT:  CLINICAL IMPRESSION: Patient is a 63 y.o. female who was seen today for physical therapy evaluation and treatment for BL plantar fascitis. Current deficits are: debilitating pain, functional lower body activity tolerance, Lower body functional strength, and balance. Pt would benefit from skilled PT to address said impairments via plan below.   OBJECTIVE IMPAIRMENTS: decreased balance, decreased strength, and pain.   REHAB POTENTIAL: Fair    CLINICAL DECISION MAKING: Evolving/moderate complexity  EVALUATION COMPLEXITY: Moderate   GOALS: Goals reviewed with patient? No  SHORT TERM GOALS: Target date: 03/21/2024   Patient will demonstrate 75% HEP compliance to show independence with self-management of condition  Baseline: 0% Goal status: INITIAL  2.  Patient will decrease worst pain to  6/10 at most to improve ADL completion and overall  QOL  Baseline: 8/10 Goal status: INITIAL   LONG TERM GOALS: Target date: 04/30/24  Patient will demonstrate a 5 improvement in LEFS to show improvements in ADL completion and overall QOL   Baseline: 26 Goal status: INITIAL  2.  Patient will decrease worst pain to 4/10 at most to improve ADL completion and overall QOL  Baseline: 8 Goal status: INITIAL  3.  Patient will be able to walk at least 75% capacity to demonstrate improvements in walking tolerance  Baseline: 25% Goal status: INITIAL   PLAN:  PT FREQUENCY: 1-2x/week  PT DURATION: 8 weeks  PLANNED INTERVENTIONS: 97110-Therapeutic exercises, 97530- Therapeutic activity, 97112- Neuromuscular re-education, 97535- Self Care, [id number]- Manual therapy, [phone]- Gait training, Patient/Family education, and Balance training  PLAN FOR NEXT SESSION: HEP assessment and progression, symptom modulation, and loading (isolated/functional). Manual therapy, aerobic, gait, and NME training as needed.      Washington Odessia Scot  PT, DPT

## 2024-03-10 ENCOUNTER — Ambulatory Visit

## 2024-03-10 DIAGNOSIS — M79672 Pain in left foot: Secondary | ICD-10-CM | POA: Diagnosis not present

## 2024-03-10 DIAGNOSIS — R6889 Other general symptoms and signs: Secondary | ICD-10-CM

## 2024-03-10 DIAGNOSIS — M79671 Pain in right foot: Secondary | ICD-10-CM

## 2024-03-10 NOTE — Therapy (Signed)
 OUTPATIENT PHYSICAL THERAPY LOWER EXTREMITY EVALUATION   Patient Name: Cynthia Villegas MRN: [id number] DOB:[birth year], 63 y.o., female Today's Date: 03/10/2024  END OF SESSION:  PT End of Session - 03/10/24 1103     Visit Number 2    Number of Visits 16    Date for Recertification  04/30/24    Authorization Type [employer]    PT Start Time 0930    PT Stop Time 1015    PT Time Calculation (min) 45 min    Activity Tolerance Patient tolerated treatment well           Past Medical History:  Diagnosis Date   Back pain    DDD (degenerative disc disease), cervical    Graves disease    History of blood clots 2014   Lumbar stenosis    Transverse myelitis (HCC)    Past Surgical History:  Procedure Laterality Date   OTHER SURGICAL HISTORY     Hysterectomy   Patient Active Problem List   Diagnosis Date Noted   Cellulitis 11/16/2021   Pulmonary embolism (HCC) 11/15/2021   Chest pain 08/04/2012   Pulmonary embolism (HCC) 08/01/2012   Hypokalemia 08/01/2012   Hypothyroidism 08/01/2012   Anemia 08/01/2012    PCP: Gerome Brunet, DO  REFERRING PROVIDER: Joya Stabs, DPM  REFERRING DIAG: M72.2 (ICD-10-CM) - Plantar fasciitis, bilateral  THERAPY DIAG:  Pain in right foot  Pain in left foot  Decreased activity tolerance  Rationale for Evaluation and Treatment: Rehabilitation  ONSET DATE: 5 months  SUBJECTIVE:   SUBJECTIVE STATEMENT: Pain is getting better slowly in terms of severity and frequency.   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  Are you having pain? Yes: NPRS scale: 2B, 4C, 8W Pain location: BL arches Pain description: sharp pain, worse in morn Aggravating factors: inactivity Relieving factors: medicine, moving it  PRECAUTIONS: None  RED FLAGS: None   WEIGHT BEARING RESTRICTIONS: No  FALLS:  Has patient fallen in last 6 months? No    PLOF:  Independent  PATIENT GOALS:  decrease pain, be able to walk more   OBJECTIVE:  Note: Objective measures were completed at Evaluation unless otherwise noted.    PATIENT SURVEYS:  LEFS  Extreme difficulty/unable (0), Quite a bit of difficulty (1), Moderate difficulty (2), Little difficulty (3), No difficulty (4) Survey date:    Any of your usual work, housework or school activities   2. Usual hobbies, recreational or sporting activities   3. Getting into/out of the bath   4. Walking between rooms   5. Putting on socks/shoes   6. Squatting    7. Lifting an object, like a bag of groceries from the floor   8. Performing light activities around your home   9. Performing heavy activities around your home   10. Getting into/out of a car   11. Walking 2 blocks   12. Walking 1 mile   13. Going up/down 10 stairs (1 flight)   14. Standing for 1 hour   15.  sitting for 1 hour   16. Running on even ground   17. Running on uneven ground   18. Making sharp turns while running fast   19. Hopping    20. Rolling over in bed   Score total:  26       POSTURE: rounded shoulders and forward head  PALPATION: TTP BL sole of feet and L dorsum (more pain  on left side)  LOWER EXTREMITY ROM:  Active ROM Right eval Left eval  Hip flexion    Hip extension    Hip abduction    Hip adduction    Hip internal rotation    Hip external rotation    Knee flexion    Knee extension    Ankle dorsiflexion 0 4  Ankle plantarflexion 45 50  Ankle inversion    Ankle eversion     (Blank rows = not tested)  LOWER EXTREMITY MMT:  MMT Right eval Left eval  Hip flexion 4- 4-  Hip extension    Hip abduction    Hip adduction    Hip internal rotation    Hip external rotation    Knee flexion 4- 4-  Knee extension 4 4  Ankle dorsiflexion 4- (p! Dorsum) 4-  Ankle plantarflexion 4 4  Ankle inversion    Ankle eversion     (Blank rows = not tested)  LOWER EXTREMITY SPECIAL TESTS:  +windlass L  side (R side also presenting w/ sx of plantar fascitis)                                                                                                                                  TREATMENT DATE:   TREATMENT TREATMENT 03/10/2024:  Therapeutic Exercise: - tennis ball stm 2x30s BL -towel scrunch 2x10x3s BL -toe extension 2x10x3s Green TB PF x10x3s BL Red TB HF 2x8x3s Red TB clamshells 2x8x3s Seated LAQ BL x8x3s Reassessed HEP  Did not do:  HS curl  Manual Therapy: - STM BL plantar arch         Therapeutic Exercise: 10/10 -tennis ball roll 2x30s BL -towel scrunches 2x8x3s -red tb PF w/ inversion 2x8x3s -educated patient about HEP, prognosis, POC, and relevant tissues and anatomy   Manual Therapy: - STM bilateral soles/arches of feet, and left dorsum of foot          PATIENT EDUCATION:  Education details: HEP Person educated: Patient Education method: Programmer, multimedia, Facilities manager, Verbal cues, and Handouts Education comprehension: verbalized understanding and returned demonstration  HOME EXERCISE PROGRAM: Access Code: [email or web address] URL: [email or web address] Date: 02/29/2024 Prepared by: Washington Scot  Exercises - Seated Toe Towel Scrunches  - 2 x daily - 5 x weekly - 2 sets - 8 reps - 3 hold - Long Sitting Ankle Plantar Flexion with Resistance  - 2 x daily - 5 x weekly - 3 sets - 8 reps - 3 hold -tennis ball roll 2x30s 5x/wk 2-3x/day  ASSESSMENT:  CLINICAL IMPRESSION:  Patient tolerated treatment with no increases in pain with progressions in BL LE loading. Current deficits include: excessive pain, functional activity tolerance, and muscle strength. As a result, patient would continue to benefit from skilled PT to address said deficits via plan below.   EVAL:Patient is a 63 y.o. female who was seen today for physical therapy evaluation and treatment for BL plantar fascitis. Current deficits are: debilitating  pain, functional lower body  activity tolerance, Lower body functional strength, and balance. Pt would benefit from skilled PT to address said impairments via plan below.   OBJECTIVE IMPAIRMENTS: decreased balance, decreased strength, and pain.   REHAB POTENTIAL: Fair    CLINICAL DECISION MAKING: Evolving/moderate complexity  EVALUATION COMPLEXITY: Moderate   GOALS: Goals reviewed with patient? No  SHORT TERM GOALS: Target date: 03/21/2024   Patient will demonstrate 75% HEP compliance to show independence with self-management of condition  Baseline: 0% Goal status: INITIAL  2.  Patient will decrease worst pain to 6/10 at most to improve ADL completion and overall QOL  Baseline: 8/10 Goal status: INITIAL   LONG TERM GOALS: Target date: 04/30/24  Patient will demonstrate a 5 improvement in LEFS to show improvements in ADL completion and overall QOL   Baseline: 26 Goal status: INITIAL  2.  Patient will decrease worst pain to 4/10 at most to improve ADL completion and overall QOL  Baseline: 8 Goal status: INITIAL  3.  Patient will be able to walk at least 75% capacity to demonstrate improvements in walking tolerance  Baseline: 25% Goal status: INITIAL   PLAN:  PT FREQUENCY: 1-2x/week  PT DURATION: 8 weeks  PLANNED INTERVENTIONS: 97110-Therapeutic exercises, 97530- Therapeutic activity, 97112- Neuromuscular re-education, 97535- Self Care, 02859- Manual therapy, [phone]- Gait training, Patient/Family education, and Balance training  PLAN FOR NEXT SESSION: HEP assessment and progression, symptom modulation, and loading (isolated/functional). Manual therapy, aerobic, gait, and NME training as needed.      Washington Odessia Scot  PT, DPT

## 2024-03-17 ENCOUNTER — Encounter: Payer: Self-pay | Admitting: Physical Therapy

## 2024-03-17 ENCOUNTER — Ambulatory Visit: Admitting: Physical Therapy

## 2024-03-17 DIAGNOSIS — R6889 Other general symptoms and signs: Secondary | ICD-10-CM

## 2024-03-17 DIAGNOSIS — M79671 Pain in right foot: Secondary | ICD-10-CM

## 2024-03-17 DIAGNOSIS — M79672 Pain in left foot: Secondary | ICD-10-CM | POA: Diagnosis not present

## 2024-03-17 NOTE — Therapy (Signed)
 OUTPATIENT PHYSICAL THERAPY LOWER EXTREMITY TREATMENT    Patient Name: Cynthia Villegas MRN: [id number] DOB:Sep 06, 1960, 63 y.o., female Today's Date: 03/17/2024  END OF SESSION:  PT End of Session - 03/17/24 1018     Visit Number 3    Number of Visits 16    Date for Recertification  04/30/24    Authorization Type [employer]    PT Start Time 1017    PT Stop Time 1100    PT Time Calculation (min) 43 min           Past Medical History:  Diagnosis Date   Back pain    DDD (degenerative disc disease), cervical    Graves disease    History of blood clots 2014   Lumbar stenosis    Transverse myelitis (HCC)    Past Surgical History:  Procedure Laterality Date   OTHER SURGICAL HISTORY     Hysterectomy   Patient Active Problem List   Diagnosis Date Noted   Cellulitis 11/16/2021   Pulmonary embolism (HCC) 11/15/2021   Chest pain 08/04/2012   Pulmonary embolism (HCC) 08/01/2012   Hypokalemia 08/01/2012   Hypothyroidism 08/01/2012   Anemia 08/01/2012    PCP: Gerome Brunet, DO  REFERRING PROVIDER: Joya Stabs, DPM  REFERRING DIAG: M72.2 (ICD-10-CM) - Plantar fasciitis, bilateral  THERAPY DIAG:  Pain in right foot  Pain in left foot  Decreased activity tolerance  Rationale for Evaluation and Treatment: Rehabilitation  ONSET DATE: 5 months  SUBJECTIVE:   SUBJECTIVE STATEMENT: Pain is getting better slowly in terms of severity and frequency. Pain 6-7/10 when awoke this morning. No foot pain at the moment. Knees hurting today.   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  Are you having pain? Yes: NPRS scale: 2B, 4C, 8W Pain location: BL arches Pain description: sharp pain, worse in morn Aggravating factors: inactivity Relieving factors: medicine, moving it  PRECAUTIONS: None  RED FLAGS: None   WEIGHT BEARING RESTRICTIONS: No  FALLS:  Has patient fallen in last 6  months? No    PLOF: Independent  PATIENT GOALS:  decrease pain, be able to walk more   OBJECTIVE:  Note: Objective measures were completed at Evaluation unless otherwise noted.    PATIENT SURVEYS:  LEFS  Extreme difficulty/unable (0), Quite a bit of difficulty (1), Moderate difficulty (2), Little difficulty (3), No difficulty (4) Survey date:    Any of your usual work, housework or school activities   2. Usual hobbies, recreational or sporting activities   3. Getting into/out of the bath   4. Walking between rooms   5. Putting on socks/shoes   6. Squatting    7. Lifting an object, like a bag of groceries from the floor   8. Performing light activities around your home   9. Performing heavy activities around your home   10. Getting into/out of a car   11. Walking 2 blocks   12. Walking 1 mile   13. Going up/down 10 stairs (1 flight)   14. Standing for 1 hour   15.  sitting for 1 hour   16. Running on even ground   17. Running on uneven ground   18. Making sharp turns while running fast   19. Hopping    20. Rolling over in bed   Score total:  26       POSTURE: rounded shoulders and forward head  PALPATION: TTP BL sole  of feet and L dorsum (more pain on left side)  LOWER EXTREMITY ROM:  Active ROM Right eval Left eval  Hip flexion    Hip extension    Hip abduction    Hip adduction    Hip internal rotation    Hip external rotation    Knee flexion    Knee extension    Ankle dorsiflexion 0 4  Ankle plantarflexion 45 50  Ankle inversion    Ankle eversion     (Blank rows = not tested)  LOWER EXTREMITY MMT:  MMT Right eval Left eval Rt/LT 03/17/24  Hip flexion 4- 4-   Hip extension     Hip abduction     Hip adduction     Hip internal rotation     Hip external rotation     Knee flexion 4- 4-   Knee extension 4 4   Ankle dorsiflexion 4- (p! Dorsum) 4-   Ankle plantarflexion 4 4   Ankle inversion   4 /4  Ankle eversion   4 /4   (Blank rows =  not tested)  LOWER EXTREMITY SPECIAL TESTS:  +windlass L side (R side also presenting w/ sx of plantar fascitis)                                                                                                                                  TREATMENT DATE:   TREATMENT [facility] Adult PT Treatment:                                                DATE: 03/17/24 Therapeutic Exercise: Seated towel inv and eversion AROM  Seated calf stretch with towel 20 sec x 2 each  Seated heel raise x 10 Seated toe raise x 10  Seated PF BTB 10 x 2 each  Red TB clamshells 2x10x3s Red TB HF 2x10x3s Seated LAQ BL 2x10x3s Updated HEP Manual Therapy: - STM BL plantar arch       TREATMENT 03/10/2024:  Therapeutic Exercise: - tennis ball stm 2x30s BL -towel scrunch 2x10x3s BL -toe extension 2x10x3s Green TB PF x10x3s BL Red TB HF 2x8x3s Red TB clamshells 2x8x3s Seated LAQ BL 2x8x3s Reassessed HEP  Did not do:  HS curl  Manual Therapy: - STM BL plantar arch         Therapeutic Exercise: 10/10 -tennis ball roll 2x30s BL -towel scrunches 2x8x3s -red tb PF w/ inversion 2x8x3s -educated patient about HEP, prognosis, POC, and relevant tissues and anatomy   Manual Therapy: - STM bilateral soles/arches of feet, and left dorsum of foot          PATIENT EDUCATION:  Education details: HEP Person educated: Patient Education method: Programmer, Multimedia, Facilities Manager, Verbal cues, and Handouts Education comprehension: verbalized understanding and returned demonstration  HOME EXERCISE PROGRAM: Access  Code: [email or web address] URL: [email or web address] Date: 02/29/2024 Prepared by: Washington Scot  Exercises - Seated Toe Towel Scrunches  - 2 x daily - 5 x weekly - 2 sets - 8 reps - 3 hold - Long Sitting Ankle Plantar Flexion with Resistance  - 2 x daily - 5 x weekly - 3 sets - 8 reps - 3 hold -tennis ball roll 2x30s 5x/wk 2-3x/day Added 03/17/24 - Ankle Inversion Eversion Towel  Slide  - 1 x daily - 7 x weekly - 2 sets - 10 reps - Seated Heel Raise  - 1 x daily - 7 x weekly - 2 sets - 10 reps - 3 hold - Seated Heel Toe Raises  - 1 x daily - 7 x weekly - 2 sets - 10 reps - 3 hold  ASSESSMENT:  CLINICAL IMPRESSION:  Patient tolerated treatment with no increases in pain with progressions in BL LE loading. Updated HEP.  deficits include: excessive pain, functional activity tolerance, and muscle strength. As a result, patient would continue to benefit from skilled PT to address said deficits via plan below.   EVAL:Patient is a 63 y.o. female who was seen today for physical therapy evaluation and treatment for BL plantar fascitis. Current deficits are: debilitating pain, functional lower body activity tolerance, Lower body functional strength, and balance. Pt would benefit from skilled PT to address said impairments via plan below.   OBJECTIVE IMPAIRMENTS: decreased balance, decreased strength, and pain.   REHAB POTENTIAL: Fair    CLINICAL DECISION MAKING: Evolving/moderate complexity  EVALUATION COMPLEXITY: Moderate   GOALS: Goals reviewed with patient? No  SHORT TERM GOALS: Target date: 03/21/2024   Patient will demonstrate 75% HEP compliance to show independence with self-management of condition  Baseline: 0% Goal status: INITIAL  2.  Patient will decrease worst pain to 6/10 at most to improve ADL completion and overall QOL  Baseline: 8/10 Goal status: INITIAL   LONG TERM GOALS: Target date: 04/30/24  Patient will demonstrate a 5 improvement in LEFS to show improvements in ADL completion and overall QOL   Baseline: 26 Goal status: INITIAL  2.  Patient will decrease worst pain to 4/10 at most to improve ADL completion and overall QOL  Baseline: 8 Goal status: INITIAL  3.  Patient will be able to walk at least 75% capacity to demonstrate improvements in walking tolerance  Baseline: 25% Goal status: INITIAL   PLAN:  PT FREQUENCY:  1-2x/week  PT DURATION: 8 weeks  PLANNED INTERVENTIONS: 97110-Therapeutic exercises, 97530- Therapeutic activity, 97112- Neuromuscular re-education, 97535- Self Care, 02859- Manual therapy, [phone]- Gait training, Patient/Family education, and Balance training  PLAN FOR NEXT SESSION: HEP assessment and progression, symptom modulation, and loading (isolated/functional). Manual therapy, aerobic, gait, and NME training as needed.      Harlene Persons, PTA 03/17/24 11:22 AM Phone: [phone] Fax: [phone]

## 2024-03-24 ENCOUNTER — Ambulatory Visit: Attending: Podiatry

## 2024-03-24 DIAGNOSIS — R6889 Other general symptoms and signs: Secondary | ICD-10-CM | POA: Insufficient documentation

## 2024-03-24 DIAGNOSIS — M79672 Pain in left foot: Secondary | ICD-10-CM | POA: Diagnosis not present

## 2024-03-24 DIAGNOSIS — M79671 Pain in right foot: Secondary | ICD-10-CM | POA: Insufficient documentation

## 2024-03-24 NOTE — Therapy (Signed)
 OUTPATIENT PHYSICAL THERAPY LOWER EXTREMITY TREATMENT    Patient Name: Cynthia Villegas MRN: [id number] DOB:09-26-1960, 63 y.o., female Today's Date: 03/24/2024  END OF SESSION:  PT End of Session - 03/24/24 0935     Visit Number 4    Number of Visits 16    Date for Recertification  04/30/24    Authorization Type [employer]    PT Start Time 0930    PT Stop Time 1015    PT Time Calculation (min) 45 min            Past Medical History:  Diagnosis Date   Back pain    DDD (degenerative disc disease), cervical    Graves disease    History of blood clots 2014   Lumbar stenosis    Transverse myelitis (HCC)    Past Surgical History:  Procedure Laterality Date   OTHER SURGICAL HISTORY     Hysterectomy   Patient Active Problem List   Diagnosis Date Noted   Cellulitis 11/16/2021   Pulmonary embolism (HCC) 11/15/2021   Chest pain 08/04/2012   Pulmonary embolism (HCC) 08/01/2012   Hypokalemia 08/01/2012   Hypothyroidism 08/01/2012   Anemia 08/01/2012    PCP: Gerome Brunet, DO  REFERRING PROVIDER: Joya Stabs, DPM  REFERRING DIAG: M72.2 (ICD-10-CM) - Plantar fasciitis, bilateral  THERAPY DIAG:  Pain in right foot  Pain in left foot  Decreased activity tolerance  Rationale for Evaluation and Treatment: Rehabilitation  ONSET DATE: 5 months  SUBJECTIVE:   SUBJECTIVE STATEMENT: Pain in R foot is pretty low, L foot is pretty irritated today. Walked some during weekend.  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  Are you having pain? Yes: NPRS scale: 2B, 4C, 8W Pain location: BL arches Pain description: sharp pain, worse in morn Aggravating factors: inactivity Relieving factors: medicine, moving it  PRECAUTIONS: None  RED FLAGS: None   WEIGHT BEARING RESTRICTIONS: No  FALLS:  Has patient fallen in last 6 months? No    PLOF: Independent  PATIENT GOALS:   decrease pain, be able to walk more   OBJECTIVE:  Note: Objective measures were completed at Evaluation unless otherwise noted.    PATIENT SURVEYS:  LEFS  Extreme difficulty/unable (0), Quite a bit of difficulty (1), Moderate difficulty (2), Little difficulty (3), No difficulty (4) Survey date:    Any of your usual work, housework or school activities   2. Usual hobbies, recreational or sporting activities   3. Getting into/out of the bath   4. Walking between rooms   5. Putting on socks/shoes   6. Squatting    7. Lifting an object, like a bag of groceries from the floor   8. Performing light activities around your home   9. Performing heavy activities around your home   10. Getting into/out of a car   11. Walking 2 blocks   12. Walking 1 mile   13. Going up/down 10 stairs (1 flight)   14. Standing for 1 hour   15.  sitting for 1 hour   16. Running on even ground   17. Running on uneven ground   18. Making sharp turns while running fast   19. Hopping    20. Rolling over in bed   Score total:  26       POSTURE: rounded shoulders and forward head  PALPATION: TTP BL sole of feet and L dorsum (more pain on left  side)  LOWER EXTREMITY ROM:  Active ROM Right eval Left eval  Hip flexion    Hip extension    Hip abduction    Hip adduction    Hip internal rotation    Hip external rotation    Knee flexion    Knee extension    Ankle dorsiflexion 0 4  Ankle plantarflexion 45 50  Ankle inversion    Ankle eversion     (Blank rows = not tested)  LOWER EXTREMITY MMT:  MMT Right eval Left eval Rt/LT 03/17/24  Hip flexion 4- 4-   Hip extension     Hip abduction     Hip adduction     Hip internal rotation     Hip external rotation     Knee flexion 4- 4-   Knee extension 4 4   Ankle dorsiflexion 4- (p! Dorsum) 4-   Ankle plantarflexion 4 4   Ankle inversion   4 /4  Ankle eversion   4 /4   (Blank rows = not tested)  LOWER EXTREMITY SPECIAL TESTS:  +windlass  L side (R side also presenting w/ sx of plantar fascitis)                                                                                                                                  TREATMENT DATE:   TREATMENT 03/24/24 HEP reassessment and update Seated toe extensions 2x8x3s Seated towel scrunch 2x8x3s Seated heel raises 2x8x3s Seated clamshell RTB, GTB, BTB 3x8x3s BL Seated ankle circles RTB 2x10x3s BL Seated PF w/ inversion x8x3s RTB, gtb x8x3s  Did not do: Glute bridges Leg extension HS curl   [facility] Adult PT Treatment:                                                DATE: 03/17/24 Therapeutic Exercise: Seated towel inv and eversion AROM  Seated calf stretch with towel 20 sec x 2 each  Seated heel raise x 10 Seated toe raise x 10  Seated PF BTB 10 x 2 each  Red TB clamshells 2x10x3s Red TB HF 2x10x3s Seated LAQ BL 2x10x3s Updated HEP Manual Therapy: - STM BL plantar arch          PATIENT EDUCATION:  Education details: HEP Person educated: Patient Education method: Programmer, Multimedia, Facilities Manager, Verbal cues, and Handouts Education comprehension: verbalized understanding and returned demonstration  HOME EXERCISE PROGRAM: Access Code: [email or web address] URL: [email or web address] Date: 02/29/2024 Prepared by: Washington Scot  Exercises - Seated Toe Towel Scrunches  - 2 x daily - 5 x weekly - 2 sets - 8 reps - 3 hold - Long Sitting Ankle Plantar Flexion with Resistance  - 2 x daily - 5 x weekly - 3 sets - 8 reps - 3 hold -tennis ball roll 2x30s 5x/wk 2-3x/day Added  03/17/24 - Ankle Inversion Eversion Towel Slide  - 1 x daily - 7 x weekly - 2 sets - 10 reps - Seated Heel Raise  - 1 x daily - 7 x weekly - 2 sets - 10 reps - 3 hold - Seated Heel Toe Raises  - 1 x daily - 7 x weekly - 2 sets - 10 reps - 3 hold 03/24/24 Plantar flexion w/ inversion GTB Ankle circles w/ GTB CW/CCW  ASSESSMENT:  CLINICAL IMPRESSION:  Patient tolerated treatment with no  increases in pain with progressions in BL LE loading. Updated HEP.  deficits include: excessive pain, functional activity tolerance, and muscle strength. As a result, patient would continue to benefit from skilled PT to address said deficits via plan below.   EVAL:Patient is a 63 y.o. female who was seen today for physical therapy evaluation and treatment for BL plantar fascitis. Current deficits are: debilitating pain, functional lower body activity tolerance, Lower body functional strength, and balance. Pt would benefit from skilled PT to address said impairments via plan below.   OBJECTIVE IMPAIRMENTS: decreased balance, decreased strength, and pain.   REHAB POTENTIAL: Fair    CLINICAL DECISION MAKING: Evolving/moderate complexity  EVALUATION COMPLEXITY: Moderate   GOALS: Goals reviewed with patient? No  SHORT TERM GOALS: Target date: 03/21/2024   Patient will demonstrate 75% HEP compliance to show independence with self-management of condition  Baseline: 0% Goal status: INITIAL  2.  Patient will decrease worst pain to 6/10 at most to improve ADL completion and overall QOL  Baseline: 8/10 Goal status: INITIAL   LONG TERM GOALS: Target date: 04/30/24  Patient will demonstrate a 5 improvement in LEFS to show improvements in ADL completion and overall QOL   Baseline: 26 Goal status: INITIAL  2.  Patient will decrease worst pain to 4/10 at most to improve ADL completion and overall QOL  Baseline: 8 Goal status: INITIAL  3.  Patient will be able to walk at least 75% capacity to demonstrate improvements in walking tolerance  Baseline: 25% Goal status: INITIAL   PLAN:  PT FREQUENCY: 1-2x/week  PT DURATION: 8 weeks  PLANNED INTERVENTIONS: 97110-Therapeutic exercises, 97530- Therapeutic activity, 97112- Neuromuscular re-education, 97535- Self Care, 02859- Manual therapy, [phone]- Gait training, Patient/Family education, and Balance training  PLAN FOR NEXT SESSION: HEP  assessment and progression, symptom modulation, and loading (isolated/functional). Manual therapy, aerobic, gait, and NME training as needed.    [place] Odessia Scot  PT, DPT

## 2024-03-31 ENCOUNTER — Ambulatory Visit

## 2024-03-31 DIAGNOSIS — M79672 Pain in left foot: Secondary | ICD-10-CM

## 2024-03-31 DIAGNOSIS — M79671 Pain in right foot: Secondary | ICD-10-CM

## 2024-03-31 DIAGNOSIS — R6889 Other general symptoms and signs: Secondary | ICD-10-CM

## 2024-03-31 NOTE — Therapy (Signed)
 OUTPATIENT PHYSICAL THERAPY LOWER EXTREMITY TREATMENT    Patient Name: Cynthia Villegas MRN: [id number] DOB:08-30-1960, 63 y.o., female Today's Date: 03/31/2024  END OF SESSION:  PT End of Session - 03/31/24 1013     Visit Number 5    Number of Visits 16    Date for Recertification  04/30/24    Authorization Type [employer]    PT Start Time 1015    PT Stop Time 1100    PT Time Calculation (min) 45 min    Activity Tolerance Patient tolerated treatment well            Past Medical History:  Diagnosis Date   Back pain    DDD (degenerative disc disease), cervical    Graves disease    History of blood clots 2014   Lumbar stenosis    Transverse myelitis (HCC)    Past Surgical History:  Procedure Laterality Date   OTHER SURGICAL HISTORY     Hysterectomy   Patient Active Problem List   Diagnosis Date Noted   Cellulitis 11/16/2021   Pulmonary embolism (HCC) 11/15/2021   Chest pain 08/04/2012   Pulmonary embolism (HCC) 08/01/2012   Hypokalemia 08/01/2012   Hypothyroidism 08/01/2012   Anemia 08/01/2012    PCP: Gerome Brunet, DO  REFERRING PROVIDER: Joya Stabs, DPM  REFERRING DIAG: M72.2 (ICD-10-CM) - Plantar fasciitis, bilateral  THERAPY DIAG:  Pain in right foot  Pain in left foot  Decreased activity tolerance  Rationale for Evaluation and Treatment: Rehabilitation  ONSET DATE: 5 months  SUBJECTIVE:   SUBJECTIVE STATEMENT: Doing better with pain, improvements in pain severity when waking up in the morn. Has not been compliant with HEP ever since visiting daughter. Walking has been improving in terms of distance before pain.   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 Reports  Are you having pain? Yes: NPRS scale: 2B, 4C, 8W Pain location: BL arches Pain description: sharp pain, worse in morn Aggravating factors: inactivity Relieving factors: medicine, moving  it  PRECAUTIONS: None  RED FLAGS: None   WEIGHT BEARING RESTRICTIONS: No  FALLS:  Has patient fallen in last 6 months? No    PLOF: Independent  PATIENT GOALS:  decrease pain, be able to walk more   OBJECTIVE:  Note: Objective measures were completed at Evaluation unless otherwise noted.    PATIENT SURVEYS:  LEFS  Extreme difficulty/unable (0), Quite a bit of difficulty (1), Moderate difficulty (2), Little difficulty (3), No difficulty (4) Survey date:    Any of your usual work, housework or school activities   2. Usual hobbies, recreational or sporting activities   3. Getting into/out of the bath   4. Walking between rooms   5. Putting on socks/shoes   6. Squatting    7. Lifting an object, like a bag of groceries from the floor   8. Performing light activities around your home   9. Performing heavy activities around your home   10. Getting into/out of a car   11. Walking 2 blocks   12. Walking 1 mile   13. Going up/down 10 stairs (1 flight)   14. Standing for 1 hour   15.  sitting for 1 hour   16. Running on even ground   17. Running on uneven ground   18. Making sharp turns while running fast   19. Hopping    20. Rolling over in bed   Score total:  26       POSTURE: rounded shoulders and forward head  PALPATION: TTP BL sole of feet and L dorsum (more pain on left side)  LOWER EXTREMITY ROM:  Active ROM Right eval Left eval  Hip flexion    Hip extension    Hip abduction    Hip adduction    Hip internal rotation    Hip external rotation    Knee flexion    Knee extension    Ankle dorsiflexion 0 4  Ankle plantarflexion 45 50  Ankle inversion    Ankle eversion     (Blank rows = not tested)  LOWER EXTREMITY MMT:  MMT Right eval Left eval Rt/LT 03/17/24  Hip flexion 4- 4-   Hip extension     Hip abduction     Hip adduction     Hip internal rotation     Hip external rotation     Knee flexion 4- 4-   Knee extension 4 4   Ankle  dorsiflexion 4- (p! Dorsum) 4-   Ankle plantarflexion 4 4   Ankle inversion   4 /4  Ankle eversion   4 /4   (Blank rows = not tested)  LOWER EXTREMITY SPECIAL TESTS:  +windlass L side (R side also presenting w/ sx of plantar fascitis)                                                                                                                                  TREATMENT DATE:  Treatment 03/31/24 Therapeutic Exercise Seated toe extensions 2x10x3s Seated heel raises 2x10x3s Seated towel scrunch 2x10x3s Seated toe yoga (unable to raise bottom 4) x2 BL Seated ankle circles RTB x10x3s, GTB 2x8x3s Seated clamshell Blue TB 2x8x3s BL Seated PF w/ inversion gtb x8x3s BL Red TB eversion x8x3s BL  Therapeutic Activity HEP reassessment and update Standing calf raise 2x6x3s   Did not do: Glute bridges (does not tolerate laying on back) Step ups Crab walks Leg extension HS curl      TREATMENT 03/24/24 HEP reassessment and update Seated toe extensions 2x8x3s Seated towel scrunch 2x8x3s Seated heel raises 2x8x3s Seated clamshell RTB, GTB, BTB 3x8x3s BL Seated ankle circles RTB 2x10x3s BL Seated PF w/ inversion x8x3s RTB, gtb x8x3s  Did not do: Glute bridges Leg extension HS curl   [facility] Adult PT Treatment:                                                DATE: 03/17/24 Therapeutic Exercise: Seated towel inv and eversion AROM  Seated calf stretch with towel 20 sec x 2 each  Seated heel raise x 10 Seated toe raise x 10  Seated PF BTB 10 x 2 each  Red TB clamshells 2x10x3s Red TB HF 2x10x3s Seated LAQ BL 2x10x3s Updated  HEP Manual Therapy: - STM BL plantar arch          PATIENT EDUCATION:  Education details: HEP Person educated: Patient Education method: Programmer, Multimedia, Demonstration, Verbal cues, and Handouts Education comprehension: verbalized understanding and returned demonstration  HOME EXERCISE PROGRAM: Access Code: [email or web address] URL:  [email or web address] Date: 02/29/2024 Prepared by: Washington Scot  Exercises - Seated Toe Towel Scrunches  - 2 x daily - 5 x weekly - 2 sets - 8 reps - 3 hold - Long Sitting Ankle Plantar Flexion with Resistance  - 2 x daily - 5 x weekly - 3 sets - 8 reps - 3 hold -tennis ball roll 2x30s 5x/wk 2-3x/day Added 03/17/24 - Ankle Inversion Eversion Towel Slide  - 1 x daily - 7 x weekly - 2 sets - 10 reps - Seated Heel Raise  - 1 x daily - 7 x weekly - 2 sets - 10 reps - 3 hold - Seated Heel Toe Raises  - 1 x daily - 7 x weekly - 2 sets - 10 reps - 3 hold 03/24/24 Plantar flexion w/ inversion GTB Ankle circles w/ GTB CW/CCW Calf raises 2x4x3s  ASSESSMENT:  CLINICAL IMPRESSION:  Patient tolerated treatment with no increases in pain with progressions in BL LE loading. Updated HEP.  deficits include: excessive pain, functional activity tolerance, and muscle strength. As a result, patient would continue to benefit from skilled PT to address said deficits via plan below.   EVAL:Patient is a 63 y.o. female who was seen today for physical therapy evaluation and treatment for BL plantar fascitis. Current deficits are: debilitating pain, functional lower body activity tolerance, Lower body functional strength, and balance. Pt would benefit from skilled PT to address said impairments via plan below.   OBJECTIVE IMPAIRMENTS: decreased balance, decreased strength, and pain.   REHAB POTENTIAL: Fair    CLINICAL DECISION MAKING: Evolving/moderate complexity  EVALUATION COMPLEXITY: Moderate   GOALS: Goals reviewed with patient? No  SHORT TERM GOALS: Target date: 03/21/2024   Patient will demonstrate 75% HEP compliance to show independence with self-management of condition  Baseline: 0% Goal status: INITIAL  2.  Patient will decrease worst pain to 6/10 at most to improve ADL completion and overall QOL  Baseline: 8/10 Goal status: INITIAL   LONG TERM GOALS: Target date:  04/30/24  Patient will demonstrate a 5 improvement in LEFS to show improvements in ADL completion and overall QOL   Baseline: 26 Goal status: INITIAL  2.  Patient will decrease worst pain to 4/10 at most to improve ADL completion and overall QOL  Baseline: 8 Goal status: INITIAL  3.  Patient will be able to walk at least 75% capacity to demonstrate improvements in walking tolerance  Baseline: 25% Goal status: INITIAL   PLAN:  PT FREQUENCY: 1-2x/week  PT DURATION: 8 weeks  PLANNED INTERVENTIONS: 97110-Therapeutic exercises, 97530- Therapeutic activity, 97112- Neuromuscular re-education, 97535- Self Care, [id number]- Manual therapy, [phone]- Gait training, Patient/Family education, and Balance training  PLAN FOR NEXT SESSION: HEP assessment and progression, symptom modulation, and loading (isolated/functional). Manual therapy, aerobic, gait, and NME training as needed.    Washington Odessia Scot  PT, DPT

## 2024-04-07 ENCOUNTER — Ambulatory Visit: Admitting: Physical Therapy

## 2024-04-07 ENCOUNTER — Encounter: Payer: Self-pay | Admitting: Physical Therapy

## 2024-04-07 DIAGNOSIS — M79671 Pain in right foot: Secondary | ICD-10-CM | POA: Diagnosis not present

## 2024-04-07 DIAGNOSIS — M79672 Pain in left foot: Secondary | ICD-10-CM

## 2024-04-07 DIAGNOSIS — R6889 Other general symptoms and signs: Secondary | ICD-10-CM

## 2024-04-07 NOTE — Therapy (Signed)
 OUTPATIENT PHYSICAL THERAPY LOWER EXTREMITY TREATMENT    Patient Name: Cynthia Villegas MRN: [id number] DOB:Apr 30, 1961, 63 y.o., female Today's Date: 04/07/2024  END OF SESSION:  PT End of Session - 04/07/24 1101     Visit Number 6    Number of Visits 16    Date for Recertification  04/30/24    Authorization Type [employer]    PT Start Time 1018    PT Stop Time 1058    PT Time Calculation (min) 40 min             Past Medical History:  Diagnosis Date   Back pain    DDD (degenerative disc disease), cervical    Graves disease    History of blood clots 2014   Lumbar stenosis    Transverse myelitis (HCC)    Past Surgical History:  Procedure Laterality Date   OTHER SURGICAL HISTORY     Hysterectomy   Patient Active Problem List   Diagnosis Date Noted   Cellulitis 11/16/2021   Pulmonary embolism (HCC) 11/15/2021   Chest pain 08/04/2012   Pulmonary embolism (HCC) 08/01/2012   Hypokalemia 08/01/2012   Hypothyroidism 08/01/2012   Anemia 08/01/2012    PCP: Gerome Brunet, DO  REFERRING PROVIDER: Joya Stabs, DPM  REFERRING DIAG: M72.2 (ICD-10-CM) - Plantar fasciitis, bilateral  THERAPY DIAG:  Pain in right foot  Pain in left foot  Decreased activity tolerance  Rationale for Evaluation and Treatment: Rehabilitation  ONSET DATE: 5 months  SUBJECTIVE:   SUBJECTIVE STATEMENT: Pain 4-5/10 at worst in mornings now. I want to improve my balance.   Doing better with pain, improvements in pain severity when waking up in the morn. Has not been compliant with HEP ever since visiting daughter. Walking has been improving in terms of distance before pain.   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 Reports  Are you having pain? Yes: NPRS scale: 2B, 4C, 8W Pain location: BL arches Pain description: sharp pain, worse in morn Aggravating factors: inactivity Relieving factors:  medicine, moving it  PRECAUTIONS: None  RED FLAGS: None   WEIGHT BEARING RESTRICTIONS: No  FALLS:  Has patient fallen in last 6 months? No    PLOF: Independent  PATIENT GOALS:  decrease pain, be able to walk more   OBJECTIVE:  Note: Objective measures were completed at Evaluation unless otherwise noted.    PATIENT SURVEYS:  LEFS  Extreme difficulty/unable (0), Quite a bit of difficulty (1), Moderate difficulty (2), Little difficulty (3), No difficulty (4) Survey date:    Any of your usual work, housework or school activities   2. Usual hobbies, recreational or sporting activities   3. Getting into/out of the bath   4. Walking between rooms   5. Putting on socks/shoes   6. Squatting    7. Lifting an object, like a bag of groceries from the floor   8. Performing light activities around your home   9. Performing heavy activities around your home   10. Getting into/out of a car   11. Walking 2 blocks   12. Walking 1 mile   13. Going up/down 10 stairs (1 flight)   14. Standing for 1 hour   15.  sitting for 1 hour   16. Running on even ground   17. Running on uneven ground   18. Making sharp turns while running fast   19. Hopping    20. Rolling  over in bed   Score total:  26       POSTURE: rounded shoulders and forward head  PALPATION: TTP BL sole of feet and L dorsum (more pain on left side)  LOWER EXTREMITY ROM:  Active ROM Right eval Left eval  Hip flexion    Hip extension    Hip abduction    Hip adduction    Hip internal rotation    Hip external rotation    Knee flexion    Knee extension    Ankle dorsiflexion 0 4  Ankle plantarflexion 45 50  Ankle inversion    Ankle eversion     (Blank rows = not tested)  LOWER EXTREMITY MMT:  MMT Right eval Left eval Rt/LT 03/17/24  Hip flexion 4- 4-   Hip extension     Hip abduction     Hip adduction     Hip internal rotation     Hip external rotation     Knee flexion 4- 4-   Knee extension 4 4    Ankle dorsiflexion 4- (p! Dorsum) 4-   Ankle plantarflexion 4 4   Ankle inversion   4 /4  Ankle eversion   4 /4   (Blank rows = not tested)  LOWER EXTREMITY SPECIAL TESTS:  +windlass L side (R side also presenting w/ sx of plantar fascitis)                                                                                                                                  TREATMENT DATE:  [facility] Adult PT Treatment:                                                DATE: 04/07/24 Seated toe scrunches Toe extensions Assisted toe yoga Metatarsal mobs, manually and self performed Seated Baps L3 taps and circles, CW/CCW bilateral  Standing calf raises 8 x 2 , 1 set with toe raise  Tandem stance 10-15 sec  each  Calf stretch 10 sec x 3 each  Plan to update HEP next visit     Treatment 03/31/24 Therapeutic Exercise Seated toe extensions 2x10x3s Seated heel raises 2x10x3s Seated towel scrunch 2x10x3s Seated toe yoga (unable to raise bottom 4) x2 BL Seated ankle circles RTB x10x3s, GTB 2x8x3s Seated clamshell Blue TB 2x8x3s BL Seated PF w/ inversion gtb x8x3s BL Red TB eversion x8x3s BL  Therapeutic Activity HEP reassessment and update Standing calf raise 2x6x3s   Did not do: Glute bridges (does not tolerate laying on back) Step ups Crab walks Leg extension HS curl      TREATMENT 03/24/24 HEP reassessment and update Seated toe extensions 2x8x3s Seated towel scrunch 2x8x3s Seated heel raises 2x8x3s Seated clamshell RTB, GTB, BTB 3x8x3s BL Seated ankle circles RTB 2x10x3s BL Seated PF w/  inversion x8x3s RTB, gtb x8x3s  Did not do: Glute bridges Leg extension HS curl   [facility] Adult PT Treatment:                                                DATE: 03/17/24 Therapeutic Exercise: Seated towel inv and eversion AROM  Seated calf stretch with towel 20 sec x 2 each  Seated heel raise x 10 Seated toe raise x 10  Seated PF BTB 10 x 2 each  Red TB clamshells 2x10x3s Red  TB HF 2x10x3s Seated LAQ BL 2x10x3s Updated HEP Manual Therapy: - STM BL plantar arch          PATIENT EDUCATION:  Education details: HEP Person educated: Patient Education method: Programmer, Multimedia, Facilities Manager, Verbal cues, and Handouts Education comprehension: verbalized understanding and returned demonstration  HOME EXERCISE PROGRAM: Access Code: [email or web address] URL: [email or web address] Date: 02/29/2024 Prepared by: Washington Scot  Exercises - Seated Toe Towel Scrunches  - 2 x daily - 5 x weekly - 2 sets - 8 reps - 3 hold - Long Sitting Ankle Plantar Flexion with Resistance  - 2 x daily - 5 x weekly - 3 sets - 8 reps - 3 hold -tennis ball roll 2x30s 5x/wk 2-3x/day Added 03/17/24 - Ankle Inversion Eversion Towel Slide  - 1 x daily - 7 x weekly - 2 sets - 10 reps - Seated Heel Raise  - 1 x daily - 7 x weekly - 2 sets - 10 reps - 3 hold - Seated Heel Toe Raises  - 1 x daily - 7 x weekly - 2 sets - 10 reps - 3 hold 03/24/24 Plantar flexion w/ inversion GTB Ankle circles w/ GTB CW/CCW Calf raises 2x4x3s  ASSESSMENT:  CLINICAL IMPRESSION: Pt reports morning pain / worst pain at most is 4-5/10. She is also independent with current. HEP.  All STGS met. Progressed with closed chain gastroc stretch and tandem stance without increased pain. Will plan to update HEP depending on response to today's session. Immediate response was no increase in pain.  Patient tolerated treatment with no increases in pain with progressions in BL LE loading. Updated HEP.  deficits include: excessive pain, functional activity tolerance, and muscle strength. As a result, patient would continue to benefit from skilled PT to address said deficits via plan below.   EVAL:Patient is a 63 y.o. female who was seen today for physical therapy evaluation and treatment for BL plantar fascitis. Current deficits are: debilitating pain, functional lower body activity tolerance, Lower body functional strength, and  balance. Pt would benefit from skilled PT to address said impairments via plan below.   OBJECTIVE IMPAIRMENTS: decreased balance, decreased strength, and pain.   REHAB POTENTIAL: Fair    CLINICAL DECISION MAKING: Evolving/moderate complexity  EVALUATION COMPLEXITY: Moderate   GOALS: Goals reviewed with patient? No  SHORT TERM GOALS: Target date: 03/21/2024   Patient will demonstrate 75% HEP compliance to show independence with self-management of condition  Baseline: 0% Goal status: MET   2.  Patient will decrease worst pain to 6/10 at most to improve ADL completion and overall QOL  Baseline: 8/10 04/07/24: worst 4-5/10 in the mornings  Goal status: MET    LONG TERM GOALS: Target date: 04/30/24  Patient will demonstrate a 5 improvement in LEFS to show improvements in ADL completion and overall QOL   Baseline: 26  Goal status: INITIAL  2.  Patient will decrease worst pain to 4/10 at most to improve ADL completion and overall QOL  Baseline: 8 Goal status: INITIAL  3.  Patient will be able to walk at least 75% capacity to demonstrate improvements in walking tolerance  Baseline: 25% Goal status: INITIAL   PLAN:  PT FREQUENCY: 1-2x/week  PT DURATION: 8 weeks  PLANNED INTERVENTIONS: 97110-Therapeutic exercises, 97530- Therapeutic activity, 97112- Neuromuscular re-education, 97535- Self Care, 02859- Manual therapy, [phone]- Gait training, Patient/Family education, and Balance training  PLAN FOR NEXT SESSION: HEP assessment and progression, symptom modulation, and loading (isolated/functional). Manual therapy, aerobic, gait, and NME training as needed.    Harlene Persons, PTA 04/07/24 11:01 AM Phone: [phone] Fax: [phone]

## 2024-04-08 DIAGNOSIS — Z1231 Encounter for screening mammogram for malignant neoplasm of breast: Secondary | ICD-10-CM | POA: Diagnosis not present

## 2024-04-10 ENCOUNTER — Encounter: Payer: Self-pay | Admitting: Physical Therapy

## 2024-04-10 ENCOUNTER — Ambulatory Visit: Admitting: Physical Therapy

## 2024-04-10 DIAGNOSIS — M79671 Pain in right foot: Secondary | ICD-10-CM

## 2024-04-10 DIAGNOSIS — M79672 Pain in left foot: Secondary | ICD-10-CM

## 2024-04-10 DIAGNOSIS — R6889 Other general symptoms and signs: Secondary | ICD-10-CM

## 2024-04-10 NOTE — Therapy (Signed)
 OUTPATIENT PHYSICAL THERAPY LOWER EXTREMITY TREATMENT    Patient Name: Cynthia Villegas MRN: [id number] DOB:Sep 09, 1960, 63 y.o., female Today's Date: 04/10/2024  END OF SESSION:  PT End of Session - 04/10/24 1020     Visit Number 7    Number of Visits 16    Date for Recertification  04/30/24    Authorization Type [employer]    PT Start Time 1018    PT Stop Time 1056    PT Time Calculation (min) 38 min             Past Medical History:  Diagnosis Date   Back pain    DDD (degenerative disc disease), cervical    Graves disease    History of blood clots 2014   Lumbar stenosis    Transverse myelitis (HCC)    Past Surgical History:  Procedure Laterality Date   OTHER SURGICAL HISTORY     Hysterectomy   Patient Active Problem List   Diagnosis Date Noted   Cellulitis 11/16/2021   Pulmonary embolism (HCC) 11/15/2021   Chest pain 08/04/2012   Pulmonary embolism (HCC) 08/01/2012   Hypokalemia 08/01/2012   Hypothyroidism 08/01/2012   Anemia 08/01/2012    PCP: Gerome Brunet, DO  REFERRING PROVIDER: Joya Stabs, DPM  REFERRING DIAG: M72.2 (ICD-10-CM) - Plantar fasciitis, bilateral  THERAPY DIAG:  Pain in right foot  Pain in left foot  Decreased activity tolerance  Rationale for Evaluation and Treatment: Rehabilitation  ONSET DATE: 5 months  SUBJECTIVE:   SUBJECTIVE STATEMENT: The feet are doing good. The rest of my body is hurting more.   Doing better with pain, improvements in pain severity when waking up in the morn. Has not been compliant with HEP ever since visiting daughter. Walking has been improving in terms of distance before pain.   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 Reports  Are you having pain? Yes: NPRS scale: 2B, 4C, 8W Pain location: BL arches Pain description: sharp pain, worse in morn Aggravating factors: inactivity Relieving factors:  medicine, moving it  PRECAUTIONS: None  RED FLAGS: None   WEIGHT BEARING RESTRICTIONS: No  FALLS:  Has patient fallen in last 6 months? No    PLOF: Independent  PATIENT GOALS:  decrease pain, be able to walk more   OBJECTIVE:  Note: Objective measures were completed at Evaluation unless otherwise noted.    PATIENT SURVEYS:  LEFS  Extreme difficulty/unable (0), Quite a bit of difficulty (1), Moderate difficulty (2), Little difficulty (3), No difficulty (4) Survey date:    Any of your usual work, housework or school activities   2. Usual hobbies, recreational or sporting activities   3. Getting into/out of the bath   4. Walking between rooms   5. Putting on socks/shoes   6. Squatting    7. Lifting an object, like a bag of groceries from the floor   8. Performing light activities around your home   9. Performing heavy activities around your home   10. Getting into/out of a car   11. Walking 2 blocks   12. Walking 1 mile   13. Going up/down 10 stairs (1 flight)   14. Standing for 1 hour   15.  sitting for 1 hour   16. Running on even ground   17. Running on uneven ground   18. Making sharp turns while running fast   19. Hopping    20. Rolling  over in bed   Score total:  26       POSTURE: rounded shoulders and forward head  PALPATION: TTP BL sole of feet and L dorsum (more pain on left side)  LOWER EXTREMITY ROM:  Active ROM Right eval Left eval  Hip flexion    Hip extension    Hip abduction    Hip adduction    Hip internal rotation    Hip external rotation    Knee flexion    Knee extension    Ankle dorsiflexion 0 4  Ankle plantarflexion 45 50  Ankle inversion    Ankle eversion     (Blank rows = not tested)  LOWER EXTREMITY MMT:  MMT Right eval Left eval Rt/LT 03/17/24  Hip flexion 4- 4-   Hip extension     Hip abduction     Hip adduction     Hip internal rotation     Hip external rotation     Knee flexion 4- 4-   Knee extension 4 4    Ankle dorsiflexion 4- (p! Dorsum) 4-   Ankle plantarflexion 4 4   Ankle inversion   4 /4  Ankle eversion   4 /4   (Blank rows = not tested)  LOWER EXTREMITY SPECIAL TESTS:  +windlass L side (R side also presenting w/ sx of plantar fascitis)                                                                                                                                  TREATMENT DATE:  [facility] Adult PT Treatment:                                                DATE: 04/10/24 Seated toe scrunches Toe extensions Assisted toe yoga Metatarsal mobs Seated Baps L3 taps and circles, CW/CCW bilateral  Standing calf raises 8 x 2 , 1 set with toe raise  Standing rocker board , blue A/P progressing to without UE Tandem stance 20-30 sec each  Calf stretch 10 sec x 3 each  Updated HEP    [facility] Adult PT Treatment:                                                DATE: 04/07/24 Seated toe scrunches Toe extensions Assisted toe yoga Metatarsal mobs, manually and self performed Seated Baps L3 taps and circles, CW/CCW bilateral  Standing calf raises 8 x 2 , 1 set with toe raise  Tandem stance 10-15 sec  each  Calf stretch 10 sec x 3 each  Plan to update HEP next visit     Treatment 03/31/24 Therapeutic Exercise Seated toe extensions 2x10x3s  Seated heel raises 2x10x3s Seated towel scrunch 2x10x3s Seated toe yoga (unable to raise bottom 4) x2 BL Seated ankle circles RTB x10x3s, GTB 2x8x3s Seated clamshell Blue TB 2x8x3s BL Seated PF w/ inversion gtb x8x3s BL Red TB eversion x8x3s BL  Therapeutic Activity HEP reassessment and update Standing calf raise 2x6x3s   Did not do: Glute bridges (does not tolerate laying on back) Step ups Crab walks Leg extension HS curl      TREATMENT 03/24/24 HEP reassessment and update Seated toe extensions 2x8x3s Seated towel scrunch 2x8x3s Seated heel raises 2x8x3s Seated clamshell RTB, GTB, BTB 3x8x3s BL Seated ankle circles RTB 2x10x3s BL  Seated PF w/ inversion x8x3s RTB, gtb x8x3s  Did not do: Glute bridges Leg extension HS curl   [facility] Adult PT Treatment:                                                DATE: 03/17/24 Therapeutic Exercise: Seated towel inv and eversion AROM  Seated calf stretch with towel 20 sec x 2 each  Seated heel raise x 10 Seated toe raise x 10  Seated PF BTB 10 x 2 each  Red TB clamshells 2x10x3s Red TB HF 2x10x3s Seated LAQ BL 2x10x3s Updated HEP Manual Therapy: - STM BL plantar arch          PATIENT EDUCATION:  Education details: HEP Person educated: Patient Education method: Programmer, Multimedia, Facilities Manager, Verbal cues, and Handouts Education comprehension: verbalized understanding and returned demonstration  HOME EXERCISE PROGRAM: Access Code: [email or web address] URL: [email or web address] Date: 02/29/2024 Prepared by: Washington Scot  Exercises - Seated Toe Towel Scrunches  - 2 x daily - 5 x weekly - 2 sets - 8 reps - 3 hold - Long Sitting Ankle Plantar Flexion with Resistance  - 2 x daily - 5 x weekly - 3 sets - 8 reps - 3 hold -tennis ball roll 2x30s 5x/wk 2-3x/day Added 03/17/24 - Ankle Inversion Eversion Towel Slide  - 1 x daily - 7 x weekly - 2 sets - 10 reps - Seated Heel Raise  - 1 x daily - 7 x weekly - 2 sets - 10 reps - 3 hold - Seated Heel Toe Raises  - 1 x daily - 7 x weekly - 2 sets - 10 reps - 3 hold 03/24/24 Plantar flexion w/ inversion GTB Ankle circles w/ GTB CW/CCW Calf raises 2x4x3s 04/10/24: - Heel Toe Raises with Counter Support  - 1 x daily - 7 x weekly - 2 sets - 10 reps - Standing Tandem Balance with Counter Support  - 1 x daily - 7 x weekly - 1 sets - 3-5 reps - 20 hold - Standing Gastroc Stretch  - 1 x daily - 7 x weekly - 1 sets - 3 reps - 20 hold  ASSESSMENT:  CLINICAL IMPRESSION: Pt reports pain in feet is improving. Wants to focus on her balance to reduce reliance on SPC. Did not have increased pain after last session.   Continued with  closed chain gastroc stretch and tandem stance without increased pain and able to update HEP.  Patient tolerated treatment with no increases in pain with progressions in BL LE loading. Updated HEP.  deficits include: excessive pain, functional activity tolerance, and muscle strength. As a result, patient would continue to benefit from skilled PT to address said deficits via plan below.  EVAL:Patient is a 63 y.o. female who was seen today for physical therapy evaluation and treatment for BL plantar fascitis. Current deficits are: debilitating pain, functional lower body activity tolerance, Lower body functional strength, and balance. Pt would benefit from skilled PT to address said impairments via plan below.   OBJECTIVE IMPAIRMENTS: decreased balance, decreased strength, and pain.   REHAB POTENTIAL: Fair    CLINICAL DECISION MAKING: Evolving/moderate complexity  EVALUATION COMPLEXITY: Moderate   GOALS: Goals reviewed with patient? No  SHORT TERM GOALS: Target date: 03/21/2024   Patient will demonstrate 75% HEP compliance to show independence with self-management of condition  Baseline: 0% Goal status: MET   2.  Patient will decrease worst pain to 6/10 at most to improve ADL completion and overall QOL  Baseline: 8/10 04/07/24: worst 4-5/10 in the mornings  Goal status: MET    LONG TERM GOALS: Target date: 04/30/24  Patient will demonstrate a 5 improvement in LEFS to show improvements in ADL completion and overall QOL   Baseline: 26 Goal status: INITIAL  2.  Patient will decrease worst pain to 4/10 at most to improve ADL completion and overall QOL  Baseline: 8 Goal status: INITIAL  3.  Patient will be able to walk at least 75% capacity to demonstrate improvements in walking tolerance  Baseline: 25% Goal status: INITIAL   PLAN:  PT FREQUENCY: 1-2x/week  PT DURATION: 8 weeks  PLANNED INTERVENTIONS: 97110-Therapeutic exercises, 97530- Therapeutic activity, 97112-  Neuromuscular re-education, 97535- Self Care, 02859- Manual therapy, [id number]- Gait training, Patient/Family education, and Balance training  PLAN FOR NEXT SESSION: HEP assessment and progression, symptom modulation, and loading (isolated/functional). Manual therapy, aerobic, gait, and NME training as needed.    Harlene Persons, PTA 04/10/24 11:07 AM Phone: [phone] Fax: [phone]

## 2024-04-12 ENCOUNTER — Ambulatory Visit

## 2024-04-21 ENCOUNTER — Ambulatory Visit

## 2024-04-21 ENCOUNTER — Ambulatory Visit: Admitting: Podiatry

## 2024-04-28 ENCOUNTER — Ambulatory Visit: Attending: Podiatry | Admitting: Physical Therapy

## 2024-04-28 ENCOUNTER — Encounter: Payer: Self-pay | Admitting: Physical Therapy

## 2024-04-28 DIAGNOSIS — M79671 Pain in right foot: Secondary | ICD-10-CM | POA: Insufficient documentation

## 2024-04-28 DIAGNOSIS — R6889 Other general symptoms and signs: Secondary | ICD-10-CM | POA: Insufficient documentation

## 2024-04-28 DIAGNOSIS — M79672 Pain in left foot: Secondary | ICD-10-CM | POA: Insufficient documentation

## 2024-04-28 DIAGNOSIS — R2689 Other abnormalities of gait and mobility: Secondary | ICD-10-CM | POA: Diagnosis present

## 2024-04-28 NOTE — Therapy (Signed)
 OUTPATIENT PHYSICAL THERAPY LOWER EXTREMITY TREATMENT    Patient Name: Cynthia Villegas MRN: [id number] DOB:November 23, 1960, 63 y.o., female Today's Date: 04/28/2024  END OF SESSION:  PT End of Session - 04/28/24 1108     Visit Number 8    Number of Visits 16    Date for Recertification  04/30/24    Authorization Type [employer]    PT Start Time 1105    PT Stop Time 1145    PT Time Calculation (min) 40 min             Past Medical History:  Diagnosis Date   Back pain    DDD (degenerative disc disease), cervical    Graves disease    History of blood clots 2014   Lumbar stenosis    Transverse myelitis (HCC)    Past Surgical History:  Procedure Laterality Date   OTHER SURGICAL HISTORY     Hysterectomy   Patient Active Problem List   Diagnosis Date Noted   Cellulitis 11/16/2021   Pulmonary embolism (HCC) 11/15/2021   Chest pain 08/04/2012   Pulmonary embolism (HCC) 08/01/2012   Hypokalemia 08/01/2012   Hypothyroidism 08/01/2012   Anemia 08/01/2012    PCP: Gerome Brunet, DO  REFERRING PROVIDER: Joya Stabs, DPM  REFERRING DIAG: M72.2 (ICD-10-CM) - Plantar fasciitis, bilateral  THERAPY DIAG:  Pain in right foot  Pain in left foot  Decreased activity tolerance  Rationale for Evaluation and Treatment: Rehabilitation  ONSET DATE: 5 months  SUBJECTIVE:   SUBJECTIVE STATEMENT: The feet are stiff in AM but no sharp pain. I want to work more on my balance.   Doing better with pain, improvements in pain severity when waking up in the morn. Has not been compliant with HEP ever since visiting daughter. Walking has been improving in terms of distance before pain.   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 Reports  Are you having pain? Yes: NPRS scale: 0/10 feet current Pain location: BL arches Pain description: sharp pain, worse in morn Aggravating factors:  inactivity Relieving factors: medicine, moving it  PRECAUTIONS: None  RED FLAGS: None   WEIGHT BEARING RESTRICTIONS: No  FALLS:  Has patient fallen in last 6 months? No    PLOF: Independent  PATIENT GOALS:  decrease pain, be able to walk more   OBJECTIVE:  Note: Objective measures were completed at Evaluation unless otherwise noted.    PATIENT SURVEYS:  LEFS  Extreme difficulty/unable (0), Quite a bit of difficulty (1), Moderate difficulty (2), Little difficulty (3), No difficulty (4) Survey date:    Any of your usual work, housework or school activities   2. Usual hobbies, recreational or sporting activities   3. Getting into/out of the bath   4. Walking between rooms   5. Putting on socks/shoes   6. Squatting    7. Lifting an object, like a bag of groceries from the floor   8. Performing light activities around your home   9. Performing heavy activities around your home   10. Getting into/out of a car   11. Walking 2 blocks   12. Walking 1 mile   13. Going up/down 10 stairs (1 flight)   14. Standing for 1 hour   15.  sitting for 1 hour   16. Running on even ground   17. Running on uneven ground   18. Making sharp turns while running fast   19. Hopping  20. Rolling over in bed   Score total:  26   LEFS 33/80    POSTURE: rounded shoulders and forward head  PALPATION: TTP BL sole of feet and L dorsum (more pain on left side)  LOWER EXTREMITY ROM:  Active ROM Right eval Left eval  Hip flexion    Hip extension    Hip abduction    Hip adduction    Hip internal rotation    Hip external rotation    Knee flexion    Knee extension    Ankle dorsiflexion 0 4  Ankle plantarflexion 45 50  Ankle inversion    Ankle eversion     (Blank rows = not tested)  LOWER EXTREMITY MMT:  MMT Right eval Left eval Rt/LT 03/17/24  Hip flexion 4- 4-   Hip extension     Hip abduction     Hip adduction     Hip internal rotation     Hip external rotation      Knee flexion 4- 4-   Knee extension 4 4   Ankle dorsiflexion 4- (p! Dorsum) 4-   Ankle plantarflexion 4 4   Ankle inversion   4 /4  Ankle eversion   4 /4   (Blank rows = not tested)  LOWER EXTREMITY SPECIAL TESTS:  +windlass L side (R side also presenting w/ sx of plantar fascitis)   [facility] Adult PT Treatment:                                                DATE: 04/28/24  Neuromuscular re-ed: Tandem stance Tandem gait with counter assist  Narrow stance with head turns, nods, EC AIREX stand Updated HEP  Physical Performance Test: BERG BALANCE TEST Sitting to Standing: 4.      Stands without using hands and stabilize independently Standing Unsupported: 4.      Stands safely for 2 minutes Sitting Unsupported: 4.     Sits for 2 minutes independently Standing to Sitting: 4.     Sits safely with minimal use of hands Transfers: 4.     Transfers safely with minor use of hands Standing with eyes closed: 4.     Stands safely for 10 seconds  Standing with feet together: 3.     Stands for 1 minute with supervision Reaching forward with outstretched arm: 3.     Reaches forward 5 inches Retrieving object from the floor: 4.      Able to pick up easily and safely Turning to look behind: 3.     Looks behind one side only, other side less weight shift Turning 360 degrees: 2.     Able to turn slowly, but safely Place alternate foot on stool: 2.     4 steps without assistance/supervision Standing with one foot in front: 3.     Independent foot ahead for 30 seconds Standing on one foot: 1.     Holds <3 seconds  Total Score: 45/56  Score Key  <36 = High risk for falls (close to 100%)  46-51 = Moderate (>50%) fall risk  37-45 = Significant (>80%) fall risk  52-55 = Lower (>25%) fall risk  Score 26.7 - 39.6 = patient should use walker full-time  Score 44 - 46.5 = patient should use cane indoor  Score 47-49.6 = patient should use cane outdoor  Score 47.9 - 51.1 =  no assistive device needed                                                                                                                                   TREATMENT DATE:  [facility] Adult PT Treatment:                                                DATE: 04/10/24 Seated toe scrunches Toe extensions Assisted toe yoga Metatarsal mobs Seated Baps L3 taps and circles, CW/CCW bilateral  Standing calf raises 8 x 2 , 1 set with toe raise  Standing rocker board , blue A/P progressing to without UE Tandem stance 20-30 sec each  Calf stretch 10 sec x 3 each  Updated HEP    [facility] Adult PT Treatment:                                                DATE: 04/07/24 Seated toe scrunches Toe extensions Assisted toe yoga Metatarsal mobs, manually and self performed Seated Baps L3 taps and circles, CW/CCW bilateral  Standing calf raises 8 x 2 , 1 set with toe raise  Tandem stance 10-15 sec  each  Calf stretch 10 sec x 3 each  Plan to update HEP next visit     Treatment 03/31/24 Therapeutic Exercise Seated toe extensions 2x10x3s Seated heel raises 2x10x3s Seated towel scrunch 2x10x3s Seated toe yoga (unable to raise bottom 4) x2 BL Seated ankle circles RTB x10x3s, GTB 2x8x3s Seated clamshell Blue TB 2x8x3s BL Seated PF w/ inversion gtb x8x3s BL Red TB eversion x8x3s BL  Therapeutic Activity HEP reassessment and update Standing calf raise 2x6x3s   Did not do: Glute bridges (does not tolerate laying on back) Step ups Crab walks Leg extension HS curl      TREATMENT 03/24/24 HEP reassessment and update Seated toe extensions 2x8x3s Seated towel scrunch 2x8x3s Seated heel raises 2x8x3s Seated clamshell RTB, GTB, BTB 3x8x3s BL Seated ankle circles RTB 2x10x3s BL Seated PF w/ inversion x8x3s RTB, gtb x8x3s  Did not do: Glute bridges Leg extension HS curl   [facility] Adult PT Treatment:                                                DATE: 03/17/24 Therapeutic Exercise: Seated towel inv and eversion AROM   Seated calf stretch with towel 20 sec x 2 each  Seated heel raise x 10 Seated toe raise x 10  Seated PF BTB 10 x 2 each  Red TB clamshells  2x10x3s Red TB HF 2x10x3s Seated LAQ BL 2x10x3s Updated HEP Manual Therapy: - STM BL plantar arch          PATIENT EDUCATION:  Education details: HEP Person educated: Patient Education method: Programmer, Multimedia, Facilities Manager, Verbal cues, and Handouts Education comprehension: verbalized understanding and returned demonstration  HOME EXERCISE PROGRAM: Access Code: [email or web address] URL: [email or web address] Date: 02/29/2024 Prepared by: Washington Scot  Exercises - Seated Toe Towel Scrunches  - 2 x daily - 5 x weekly - 2 sets - 8 reps - 3 hold - Long Sitting Ankle Plantar Flexion with Resistance  - 2 x daily - 5 x weekly - 3 sets - 8 reps - 3 hold -tennis ball roll 2x30s 5x/wk 2-3x/day Added 03/17/24 - Ankle Inversion Eversion Towel Slide  - 1 x daily - 7 x weekly - 2 sets - 10 reps - Seated Heel Raise  - 1 x daily - 7 x weekly - 2 sets - 10 reps - 3 hold - Seated Heel Toe Raises  - 1 x daily - 7 x weekly - 2 sets - 10 reps - 3 hold 03/24/24 Plantar flexion w/ inversion GTB Ankle circles w/ GTB CW/CCW Calf raises 2x4x3s 04/10/24: - Heel Toe Raises with Counter Support  - 1 x daily - 7 x weekly - 2 sets - 10 reps - Standing Tandem Balance with Counter Support  - 1 x daily - 7 x weekly - 1 sets - 3-5 reps - 20 hold - Standing Gastroc Stretch  - 1 x daily - 7 x weekly - 1 sets - 3 reps - 20 hold 04/28/24:  - Tandem Walking with Counter Support  - 1 x daily - 7 x weekly - 3 sets   ASSESSMENT:  CLINICAL IMPRESSION: Pt reports pain in feet has improved. She only experiences stiffness in the AM, no longer has sharp pains. Has been compliant with HEP. Walked barefoot in sand last week with HHA from daughter and did not experience pain. Walking capacity rated at 55% , improved from 25%. Pt would like to improve balance so that she can  reduce reliance on SPC. She will see PT next session for full re-evaluation to extend POC / add balance goals. Captured BERG balance today with score o 45/56.   EVAL:Patient is a 63 y.o. female who was seen today for physical therapy evaluation and treatment for BL plantar fascitis. Current deficits are: debilitating pain, functional lower body activity tolerance, Lower body functional strength, and balance. Pt would benefit from skilled PT to address said impairments via plan below.   OBJECTIVE IMPAIRMENTS: decreased balance, decreased strength, and pain.   REHAB POTENTIAL: Fair    CLINICAL DECISION MAKING: Evolving/moderate complexity  EVALUATION COMPLEXITY: Moderate   GOALS: Goals reviewed with patient? No  SHORT TERM GOALS: Target date: 03/21/2024   Patient will demonstrate 75% HEP compliance to show independence with self-management of condition  Baseline: 0% Goal status: MET   2.  Patient will decrease worst pain to 6/10 at most to improve ADL completion and overall QOL  Baseline: 8/10 04/07/24: worst 4-5/10 in the mornings  Goal status: MET    LONG TERM GOALS: Target date: 04/30/24  Patient will demonstrate a 5 improvement in LEFS to show improvements in ADL completion and overall QOL   Baseline: 26 04/28/24: 33 Goal status: MET   2.  Patient will decrease worst pain to 4/10 at most to improve ADL completion and overall QOL  Baseline: 8 04/28/24: 4-5/10 in the morning only  Goal status: ONGOING   3.  Patient will be able to walk at least 75% capacity to demonstrate improvements in walking tolerance  Baseline: 25% 04/28/24: 55%  Goal status: ONGOING   PLAN:  PT FREQUENCY: 1-2x/week  PT DURATION: 8 weeks  PLANNED INTERVENTIONS: 97110-Therapeutic exercises, 97530- Therapeutic activity, 97112- Neuromuscular re-education, 97535- Self Care, 02859- Manual therapy, [id number]- Gait training, Patient/Family education, and Balance training  PLAN FOR NEXT SESSION: HEP  assessment and progression, symptom modulation, and loading (isolated/functional). Manual therapy, aerobic, gait, and NME training as needed. Add balance goals.    Harlene Persons, PTA 04/28/24 12:43 PM Phone: [phone] Fax: [phone]

## 2024-05-07 ENCOUNTER — Ambulatory Visit

## 2024-05-07 DIAGNOSIS — R6889 Other general symptoms and signs: Secondary | ICD-10-CM

## 2024-05-07 DIAGNOSIS — M79672 Pain in left foot: Secondary | ICD-10-CM

## 2024-05-07 DIAGNOSIS — M79671 Pain in right foot: Secondary | ICD-10-CM

## 2024-05-07 DIAGNOSIS — R2689 Other abnormalities of gait and mobility: Secondary | ICD-10-CM

## 2024-05-07 NOTE — Therapy (Addendum)
 OUTPATIENT PHYSICAL THERAPY LOWER EXTREMITY TREATMENT  Progress Note Reporting Period 10/10 to 12/17  See note below for Objective Data and Assessment of Progress/Goals.    Patient Name: Cynthia Villegas MRN: [id number] DOB:02-02-1961, 63 y.o., female Today's Date: 05/07/2024  END OF SESSION:  PT End of Session - 05/07/24 1020     Visit Number 9    Number of Visits 16    Date for Recertification  07/02/24    Authorization Type [employer]    PT Start Time 1015    PT Stop Time 1055    PT Time Calculation (min) 40 min    Activity Tolerance Patient tolerated treatment well              Past Medical History:  Diagnosis Date   Back pain    DDD (degenerative disc disease), cervical    Graves disease    History of blood clots 2014   Lumbar stenosis    Transverse myelitis (HCC)    Past Surgical History:  Procedure Laterality Date   OTHER SURGICAL HISTORY     Hysterectomy   Patient Active Problem List   Diagnosis Date Noted   Cellulitis 11/16/2021   Pulmonary embolism (HCC) 11/15/2021   Chest pain 08/04/2012   Pulmonary embolism (HCC) 08/01/2012   Hypokalemia 08/01/2012   Hypothyroidism 08/01/2012   Anemia 08/01/2012    PCP: Gerome Brunet, DO  REFERRING PROVIDER: Joya Stabs, DPM  REFERRING DIAG: M72.2 (ICD-10-CM) - Plantar fasciitis, bilateral  THERAPY DIAG:  Pain in right foot  Pain in left foot  Decreased activity tolerance  Balance problems  Rationale for Evaluation and Treatment: Rehabilitation  ONSET DATE: 5 months  SUBJECTIVE:   SUBJECTIVE STATEMENT: The feet are stiff in AM but no sharp pain. Lost HEP sheet and has been doing the ones she remembers from memory.  Doing better with pain, improvements in pain severity when waking up in the morn. Has not been compliant with HEP ever since visiting daughter. Walking has been improving in terms of distance before pain.   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 Reports  Are you having pain? Yes: NPRS scale: 0/10 feet current Pain location: BL arches Pain description: sharp pain, worse in morn Aggravating factors: inactivity Relieving factors: medicine, moving it  PRECAUTIONS: None  RED FLAGS: None   WEIGHT BEARING RESTRICTIONS: No  FALLS:  Has patient fallen in last 6 months? No    PLOF: Independent  PATIENT GOALS:  decrease pain, be able to walk more   OBJECTIVE:  Note: Objective measures were completed at Evaluation unless otherwise noted.    PATIENT SURVEYS:  LEFS  Extreme difficulty/unable (0), Quite a bit of difficulty (1), Moderate difficulty (2), Little difficulty (3), No difficulty (4) Survey date:    Any of your usual work, housework or school activities   2. Usual hobbies, recreational or sporting activities   3. Getting into/out of the bath   4. Walking between rooms   5. Putting on socks/shoes   6. Squatting    7. Lifting an object, like a bag of groceries from the floor   8. Performing light activities around your home   9. Performing heavy activities around your home   10. Getting into/out of a car   11. Walking 2 blocks   12. Walking 1 mile   13. Going up/down 10 stairs (1 flight)   14. Standing for 1  hour   15.  sitting for 1 hour   16. Running on even ground   17. Running on uneven ground   18. Making sharp turns while running fast   19. Hopping    20. Rolling over in bed   Score total:  26   LEFS 33/80    POSTURE: rounded shoulders and forward head  PALPATION: TTP BL sole of feet and L dorsum (more pain on left side)  LOWER EXTREMITY ROM:  Active ROM Right eval Left eval  Hip flexion    Hip extension    Hip abduction    Hip adduction    Hip internal rotation    Hip external rotation    Knee flexion    Knee extension    Ankle dorsiflexion 0 4  Ankle plantarflexion 45 50  Ankle inversion    Ankle eversion     (Blank rows =  not tested)  LOWER EXTREMITY MMT:  MMT Right eval Left eval Rt/LT 03/17/24  Hip flexion 4- 4-   Hip extension     Hip abduction     Hip adduction     Hip internal rotation     Hip external rotation     Knee flexion 4- 4-   Knee extension 4 4   Ankle dorsiflexion 4- (p! Dorsum) 4-   Ankle plantarflexion 4 4   Ankle inversion   4 /4  Ankle eversion   4 /4   (Blank rows = not tested)  LOWER EXTREMITY SPECIAL TESTS:  +windlass L side (R side also presenting w/ sx of plantar fascitis)     Physical Performance Test: BERG BALANCE TEST Sitting to Standing: 4.      Stands without using hands and stabilize independently Standing Unsupported: 4.      Stands safely for 2 minutes Sitting Unsupported: 4.     Sits for 2 minutes independently Standing to Sitting: 4.     Sits safely with minimal use of hands Transfers: 4.     Transfers safely with minor use of hands Standing with eyes closed: 4.     Stands safely for 10 seconds  Standing with feet together: 3.     Stands for 1 minute with supervision Reaching forward with outstretched arm: 3.     Reaches forward 5 inches Retrieving object from the floor: 4.      Able to pick up easily and safely Turning to look behind: 3.     Looks behind one side only, other side less weight shift Turning 360 degrees: 2.     Able to turn slowly, but safely Place alternate foot on stool: 2.     4 steps without assistance/supervision Standing with one foot in front: 3.     Independent foot ahead for 30 seconds Standing on one foot: 1.     Holds <3 seconds  Total Score: 45/56  Score Key  <36 = High risk for falls (close to 100%)  46-51 = Moderate (>50%) fall risk  37-45 = Significant (>80%) fall risk  52-55 = Lower (>25%) fall risk  Score 26.7 - 39.6 = patient should use walker full-time  Score 44 - 46.5 = patient should use cane indoor  Score 47-49.6 = patient should use cane outdoor  Score 47.9 - 51.1 = no assistive device needed  TREATMENT DATE:  05/07/24 Therapeutic Activity HEP reassessment and review Functional reassessment and goal update  Neuromuscular reeducation Seated ankle circles CW/CCW 2x12 Rtb pf and inv 2x8x3s Rtb clamshell x8x3s Rtb seated hip marches x4x3s Tandem marches S UE support x12 ft Calf raises x4x3s B UE support Standing Hip marches x6x3s S UE support    [facility] Adult PT Treatment:                                                DATE: 04/28/24  Neuromuscular re-ed: Tandem stance Tandem gait with counter assist  Narrow stance with head turns, nods, EC AIREX stand Updated HEP     [facility] Adult PT Treatment:                                                DATE: 04/10/24 Seated toe scrunches Toe extensions Assisted toe yoga Metatarsal mobs Seated Baps L3 taps and circles, CW/CCW bilateral  Standing calf raises 8 x 2 , 1 set with toe raise  Standing rocker board , blue A/P progressing to without UE Tandem stance 20-30 sec each  Calf stretch 10 sec x 3 each  Updated HEP    [facility] Adult PT Treatment:                                                DATE: 04/07/24 Seated toe scrunches Toe extensions Assisted toe yoga Metatarsal mobs, manually and self performed Seated Baps L3 taps and circles, CW/CCW bilateral  Standing calf raises 8 x 2 , 1 set with toe raise  Tandem stance 10-15 sec  each  Calf stretch 10 sec x 3 each  Plan to update HEP next visit     Treatment 03/31/24 Therapeutic Exercise Seated toe extensions 2x10x3s Seated heel raises 2x10x3s Seated towel scrunch 2x10x3s Seated toe yoga (unable to raise bottom 4) x2 BL Seated ankle circles RTB x10x3s, GTB 2x8x3s Seated clamshell Blue TB 2x8x3s BL Seated PF w/ inversion gtb x8x3s BL Red TB eversion x8x3s BL  Therapeutic Activity HEP reassessment and update Standing calf raise 2x6x3s   Did not  do: Glute bridges (does not tolerate laying on back) Step ups Crab walks Leg extension HS curl      TREATMENT 03/24/24 HEP reassessment and update Seated toe extensions 2x8x3s Seated towel scrunch 2x8x3s Seated heel raises 2x8x3s Seated clamshell RTB, GTB, BTB 3x8x3s BL Seated ankle circles RTB 2x10x3s BL Seated PF w/ inversion x8x3s RTB, gtb x8x3s  Did not do: Glute bridges Leg extension HS curl   [facility] Adult PT Treatment:                                                DATE: 03/17/24 Therapeutic Exercise: Seated towel inv and eversion AROM  Seated calf stretch with towel 20 sec x 2 each  Seated heel raise x 10 Seated toe raise x 10  Seated PF BTB 10 x 2 each  Red TB  clamshells 2x10x3s Red TB HF 2x10x3s Seated LAQ BL 2x10x3s Updated HEP Manual Therapy: - STM BL plantar arch          PATIENT EDUCATION:  Education details: HEP Person educated: Patient Education method: Programmer, Multimedia, Facilities Manager, Verbal cues, and Handouts Education comprehension: verbalized understanding and returned demonstration  HOME EXERCISE PROGRAM: Access Code: [email or web address] URL: [email or web address] Date: 02/29/2024 Prepared by: Washington Scot  Exercises 04/10/24: - Heel Toe Raises with Counter Support  - 1 x daily - 7 x weekly - 2 sets - 10 reps - Standing Tandem Balance with Counter Support  - 1 x daily - 7 x weekly - 1 sets - 3-5 reps - 20 hold - Standing Gastroc Stretch  - 1 x daily - 7 x weekly - 1 sets - 3 reps - 20 hold 04/28/24:  Red TB clamshell Counter calf raises x4x3s - Tandem Walking with Counter Support  - 1 x daily - 7 x weekly - 3 sets RTB plantar flexion and inversion 5x/wk 2x6x3s    ASSESSMENT:  CLINICAL IMPRESSION:  Patient tolerated treatment with no increases in pain with progressions in BL hip and BL LE balance. Current deficits include: static and dynamic balance, functional strength. Pt has met most of goals regarding plantar fascitis and has  had new balance goals added. As a result, patient would continue to benefit from skilled PT to address said deficits via plan below.     EVAL:Patient is a 63 y.o. female who was seen today for physical therapy evaluation and treatment for BL plantar fascitis. Current deficits are: debilitating pain, functional lower body activity tolerance, Lower body functional strength, and balance. Pt would benefit from skilled PT to address said impairments via plan below.   OBJECTIVE IMPAIRMENTS: decreased balance, decreased strength, and pain.   REHAB POTENTIAL: Fair    CLINICAL DECISION MAKING: Evolving/moderate complexity  EVALUATION COMPLEXITY: Moderate   GOALS: Goals reviewed with patient? No  SHORT TERM GOALS: Target date: 03/21/2024   Patient will demonstrate 75% HEP compliance to show independence with self-management of condition  Baseline: 0% Goal status: MET   2.  Patient will decrease worst pain to 6/10 at most to improve ADL completion and overall QOL  Baseline: 8/10 04/07/24: worst 4-5/10 in the mornings  Goal status: MET    LONG TERM GOALS: Target date: 07/02/24  Patient will demonstrate a 5 improvement in LEFS to show improvements in ADL completion and overall QOL   Baseline: 26 04/28/24: 33 Goal status: MET   2.  Patient will decrease worst pain to 4/10 at most to improve ADL completion and overall QOL  Baseline: 8 04/28/24: 4-5/10 in the morning only  Goal status: ONGOING   3.  Patient will be able to walk at least 75% capacity to demonstrate improvements in walking tolerance  Baseline: 25% 04/28/24: 55%  Goal status: ONGOING  4. Patient will Improve BERG to at least 47 to demonstrate improvements in functional balance and overall QOL  Baseline: 45  Goal status: initial  5. Pt will be able to walk for 50 feet without cane to demonstrate improvements in BL LE activity tolerance, balance, and overall QOL.  Baseline: unable  Goal status:  initial     PLAN:  PT FREQUENCY: 1-2x/week  PT DURATION: 8 weeks  PLANNED INTERVENTIONS: 97110-Therapeutic exercises, 97530- Therapeutic activity, [id number]- Neuromuscular re-education, 97535- Self Care, [id number]- Manual therapy, [phone]- Gait training, Patient/Family education, and Balance training  PLAN FOR NEXT SESSION: HEP assessment and progression, symptom modulation, and loading (  isolated/functional). Manual therapy, aerobic, gait, and NME training as needed. Work on functional strengthening of BL LE and progressive balancing with end goal of reducing dependence of walker.   Washington Odessia Scot  PT, DPT

## 2024-05-19 ENCOUNTER — Ambulatory Visit

## 2024-05-26 ENCOUNTER — Ambulatory Visit: Attending: Podiatry

## 2024-05-26 DIAGNOSIS — R6889 Other general symptoms and signs: Secondary | ICD-10-CM | POA: Insufficient documentation

## 2024-05-26 DIAGNOSIS — R2689 Other abnormalities of gait and mobility: Secondary | ICD-10-CM | POA: Diagnosis present

## 2024-05-26 NOTE — Therapy (Signed)
 " OUTPATIENT PHYSICAL THERAPY LOWER EXTREMITY TREATMENT  Progress Note Reporting Period 10/10 to 12/17  See note below for Objective Data and Assessment of Progress/Goals.    Patient Name: Cynthia Villegas MRN: [id number] DOB:20-Nov-1960, 64 y.o., female Today's Date: 05/26/2024  END OF SESSION:  PT End of Session - 05/26/24 1104     Visit Number 10    Number of Visits 16    Date for Recertification  07/02/24    Authorization Type [employer]    PT Start Time 1105   pt late   PT Stop Time 1145    PT Time Calculation (min) 40 min    Activity Tolerance Patient tolerated treatment well               Past Medical History:  Diagnosis Date   Back pain    DDD (degenerative disc disease), cervical    Graves disease    History of blood clots 2014   Lumbar stenosis    Transverse myelitis (HCC)    Past Surgical History:  Procedure Laterality Date   OTHER SURGICAL HISTORY     Hysterectomy   Patient Active Problem List   Diagnosis Date Noted   Cellulitis 11/16/2021   Pulmonary embolism (HCC) 11/15/2021   Chest pain 08/04/2012   Pulmonary embolism (HCC) 08/01/2012   Hypokalemia 08/01/2012   Hypothyroidism 08/01/2012   Anemia 08/01/2012    PCP: Gerome Brunet, DO  REFERRING PROVIDER: Joya Stabs, DPM  REFERRING DIAG: M72.2 (ICD-10-CM) - Plantar fasciitis, bilateral  THERAPY DIAG:  Decreased activity tolerance  Balance problems  Rationale for Evaluation and Treatment: Rehabilitation  ONSET DATE: 5 months  SUBJECTIVE:   SUBJECTIVE STATEMENT: Sore today 4/10 with glutes from New years party. Been busy with the holidays; 50% compliance. Had one fall rushing out of house since last visit.   Doing better with pain, improvements in pain severity when waking up in the morn. Has not been compliant with HEP ever since visiting daughter. Walking has been improving in terms of distance before pain.   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 Reports  Are you having pain? Yes: NPRS scale: 0/10 feet current Pain location: BL arches Pain description: sharp pain, worse in morn Aggravating factors: inactivity Relieving factors: medicine, moving it  PRECAUTIONS: None  RED FLAGS: None   WEIGHT BEARING RESTRICTIONS: No  FALLS:  Has patient fallen in last 6 months? No    PLOF: Independent  PATIENT GOALS:  decrease pain, be able to walk more   OBJECTIVE:  Note: Objective measures were completed at Evaluation unless otherwise noted.    PATIENT SURVEYS:  LEFS  Extreme difficulty/unable (0), Quite a bit of difficulty (1), Moderate difficulty (2), Little difficulty (3), No difficulty (4) Survey date:    Any of your usual work, housework or school activities   2. Usual hobbies, recreational or sporting activities   3. Getting into/out of the bath   4. Walking between rooms   5. Putting on socks/shoes   6. Squatting    7. Lifting an object, like a bag of groceries from the floor   8. Performing light activities around your home   9. Performing heavy activities around your home   10. Getting into/out of a car   11. Walking 2 blocks   12. Walking 1 mile   13. Going up/down 10 stairs (1 flight)   14. Standing for 1 hour  15.  sitting for 1 hour   16. Running on even ground   17. Running on uneven ground   18. Making sharp turns while running fast   19. Hopping    20. Rolling over in bed   Score total:  26   LEFS 33/80    POSTURE: rounded shoulders and forward head  PALPATION: TTP BL sole of feet and L dorsum (more pain on left side)  LOWER EXTREMITY ROM:  Active ROM Right eval Left eval  Hip flexion    Hip extension    Hip abduction    Hip adduction    Hip internal rotation    Hip external rotation    Knee flexion    Knee extension    Ankle dorsiflexion 0 4  Ankle plantarflexion 45 50  Ankle inversion    Ankle eversion     (Blank  rows = not tested)  LOWER EXTREMITY MMT:  MMT Right eval Left eval Rt/LT 03/17/24  Hip flexion 4- 4-   Hip extension     Hip abduction     Hip adduction     Hip internal rotation     Hip external rotation     Knee flexion 4- 4-   Knee extension 4 4   Ankle dorsiflexion 4- (p! Dorsum) 4-   Ankle plantarflexion 4 4   Ankle inversion   4 /4  Ankle eversion   4 /4   (Blank rows = not tested)  LOWER EXTREMITY SPECIAL TESTS:  +windlass L side (R side also presenting w/ sx of plantar fascitis)     Physical Performance Test: BERG BALANCE TEST Sitting to Standing: 4.      Stands without using hands and stabilize independently Standing Unsupported: 4.      Stands safely for 2 minutes Sitting Unsupported: 4.     Sits for 2 minutes independently Standing to Sitting: 4.     Sits safely with minimal use of hands Transfers: 4.     Transfers safely with minor use of hands Standing with eyes closed: 4.     Stands safely for 10 seconds  Standing with feet together: 3.     Stands for 1 minute with supervision Reaching forward with outstretched arm: 3.     Reaches forward 5 inches Retrieving object from the floor: 4.      Able to pick up easily and safely Turning to look behind: 3.     Looks behind one side only, other side less weight shift Turning 360 degrees: 2.     Able to turn slowly, but safely Place alternate foot on stool: 2.     4 steps without assistance/supervision Standing with one foot in front: 3.     Independent foot ahead for 30 seconds Standing on one foot: 1.     Holds <3 seconds  Total Score: 45/56  Score Key  <36 = High risk for falls (close to 100%)  46-51 = Moderate (>50%) fall risk  37-45 = Significant (>80%) fall risk  52-55 = Lower (>25%) fall risk  Score 26.7 - 39.6 = patient should use walker full-time  Score 44 - 46.5 = patient should use cane indoor  Score 47-49.6 = patient should use cane outdoor  Score 47.9 - 51.1 = no assistive device needed  TREATMENT DATE:  05/26/24 Therapeutic Exercise: HEP reassessment and update Seated ankle circles CW/CCW 2x8 BL RTB Rtb clamshell x8x3s, GTB 2x6x3s Rtb seated hip marches x4x3s, x6x3s BW  Neuromuscular Reeducation Tandem marches S UE support x20 ft  Calf raises x4x3s B UE support, 2x6x3s SUE support, x4x3s no BUE  Standing Hip marches x6x3s S UE support  Standing RTB pallof press X8 BL       Neuromuscular Reeducation:  05/07/24 Therapeutic Activity HEP reassessment and review Functional reassessment and goal update  Neuromuscular reeducation Seated ankle circles CW/CCW 2x12 Rtb pf and inv 2x8x3s Rtb clamshell x8x3s Rtb seated hip marches x4x3s, x6x3s bodyweight Tandem marches S UE support x12 ft Calf raises x4x3s B UE support Standing Hip marches x6x3s S UE support    [facility] Adult PT Treatment:                                                DATE: 04/28/24  Neuromuscular re-ed: Tandem stance Tandem gait with counter assist  Narrow stance with head turns, nods, EC AIREX stand Updated HEP     [facility] Adult PT Treatment:                                                DATE: 04/10/24 Seated toe scrunches Toe extensions Assisted toe yoga Metatarsal mobs Seated Baps L3 taps and circles, CW/CCW bilateral  Standing calf raises 8 x 2 , 1 set with toe raise  Standing rocker board , blue A/P progressing to without UE Tandem stance 20-30 sec each  Calf stretch 10 sec x 3 each  Updated HEP    [facility] Adult PT Treatment:                                                DATE: 04/07/24 Seated toe scrunches Toe extensions Assisted toe yoga Metatarsal mobs, manually and self performed Seated Baps L3 taps and circles, CW/CCW bilateral  Standing calf raises 8 x 2 , 1 set with toe raise  Tandem stance 10-15 sec  each  Calf stretch 10 sec x 3 each  Plan to  update HEP next visit     Treatment 03/31/24 Therapeutic Exercise Seated toe extensions 2x10x3s Seated heel raises 2x10x3s Seated towel scrunch 2x10x3s Seated toe yoga (unable to raise bottom 4) x2 BL Seated ankle circles RTB x10x3s, GTB 2x8x3s Seated clamshell Blue TB 2x8x3s BL Seated PF w/ inversion gtb x8x3s BL Red TB eversion x8x3s BL  Therapeutic Activity HEP reassessment and update Standing calf raise 2x6x3s   Did not do: Glute bridges (does not tolerate laying on back) Step ups Crab walks Leg extension HS curl      TREATMENT 03/24/24 HEP reassessment and update Seated toe extensions 2x8x3s Seated towel scrunch 2x8x3s Seated heel raises 2x8x3s Seated clamshell RTB, GTB, BTB 3x8x3s BL Seated ankle circles RTB 2x10x3s BL Seated PF w/ inversion x8x3s RTB, gtb x8x3s  Did not do: Glute bridges Leg extension HS curl   [facility] Adult PT Treatment:  DATE: 03/17/24 Therapeutic Exercise: Seated towel inv and eversion AROM  Seated calf stretch with towel 20 sec x 2 each  Seated heel raise x 10 Seated toe raise x 10  Seated PF BTB 10 x 2 each  Red TB clamshells 2x10x3s Red TB HF 2x10x3s Seated LAQ BL 2x10x3s Updated HEP Manual Therapy: - STM BL plantar arch          PATIENT EDUCATION:  Education details: HEP Person educated: Patient Education method: Programmer, Multimedia, Facilities Manager, Verbal cues, and Handouts Education comprehension: verbalized understanding and returned demonstration  HOME EXERCISE PROGRAM: Access Code: [email or web address] URL: [email or web address] Date: 05/26/2024 Prepared by: Washington Scot  Exercises - Heel Raises with Counter Support  - 2 x daily - 5 x weekly - 2 sets - 8 reps - 3 hold - Standing March with Unilateral Counter Support  - 2 x daily - 5 x weekly - 2 sets - 8 reps - 3 hold - Seated Hip Abduction with Resistance  - 2 x daily - 5 x weekly - 2 sets - 8 reps - 3 hold -  Walking with Counter Support  - 2 x daily - 5 x weekly - 2 sets - 8 reps - 3 hold    ASSESSMENT:  CLINICAL IMPRESSION:  Patient tolerated treatment with no increases in pain with progressions in BL hip and BL LE balance. Current deficits include: static and dynamic balance, functional strength. Pt has met most of goals regarding plantar fascitis and has had new balance goals added. As a result, patient would continue to benefit from skilled PT to address said deficits via plan below.     EVAL:Patient is a 64 y.o. female who was seen today for physical therapy evaluation and treatment for BL plantar fascitis. Current deficits are: debilitating pain, functional lower body activity tolerance, Lower body functional strength, and balance. Pt would benefit from skilled PT to address said impairments via plan below.   OBJECTIVE IMPAIRMENTS: decreased balance, decreased strength, and pain.   REHAB POTENTIAL: Fair    CLINICAL DECISION MAKING: Evolving/moderate complexity  EVALUATION COMPLEXITY: Moderate   GOALS: Goals reviewed with patient? No  SHORT TERM GOALS: Target date: 03/21/2024   Patient will demonstrate 75% HEP compliance to show independence with self-management of condition  Baseline: 0% Goal status: MET   2.  Patient will decrease worst pain to 6/10 at most to improve ADL completion and overall QOL  Baseline: 8/10 04/07/24: worst 4-5/10 in the mornings  Goal status: MET    LONG TERM GOALS: Target date: 07/02/24  Patient will demonstrate a 5 improvement in LEFS to show improvements in ADL completion and overall QOL   Baseline: 26 04/28/24: 33 Goal status: MET   2.  Patient will decrease worst pain to 4/10 at most to improve ADL completion and overall QOL  Baseline: 8 04/28/24: 4-5/10 in the morning only  Goal status: ONGOING   3.  Patient will be able to walk at least 75% capacity to demonstrate improvements in walking tolerance  Baseline: 25% 04/28/24: 55%   Goal status: ONGOING  4. Patient will Improve BERG to at least 47 to demonstrate improvements in functional balance and overall QOL  Baseline: 45  Goal status: initial  5. Pt will be able to walk for 50 feet without cane to demonstrate improvements in BL LE activity tolerance, balance, and overall QOL.  Baseline: unable  Goal status: initial     PLAN:  PT FREQUENCY: 1-2x/week  PT DURATION: 8 weeks  PLANNED INTERVENTIONS:  97110-Therapeutic exercises, 97530- Therapeutic activity, [id number]- Neuromuscular re-education, 97535- Self Care, 02859- Manual therapy, [phone]- Gait training, Patient/Family education, and Balance training  PLAN FOR NEXT SESSION: HEP assessment and progression, symptom modulation, and loading (isolated/functional). Manual therapy, aerobic, gait, and NME training as needed. Work on functional strengthening of BL LE and progressive balancing with end goal of reducing dependence of walker/cane.   Washington Odessia Scot  PT, DPT     "

## 2024-06-02 ENCOUNTER — Ambulatory Visit

## 2024-06-02 DIAGNOSIS — R2689 Other abnormalities of gait and mobility: Secondary | ICD-10-CM

## 2024-06-02 DIAGNOSIS — R6889 Other general symptoms and signs: Secondary | ICD-10-CM

## 2024-06-02 NOTE — Therapy (Signed)
 " OUTPATIENT PHYSICAL THERAPY LOWER EXTREMITY TREATMENT  Progress Note Reporting Period 10/10 to 12/17  See note below for Objective Data and Assessment of Progress/Goals.    Patient Name: Cynthia Villegas MRN: [id number] DOB:1961-04-22, 64 y.o., female Today's Date: 06/02/2024  END OF SESSION:  PT End of Session - 06/02/24 1105     Visit Number 11    Number of Visits 16    Date for Recertification  07/02/24    Authorization Type [employer]    PT Start Time 1105   pt late   PT Stop Time 1145    PT Time Calculation (min) 40 min    Activity Tolerance Patient tolerated treatment well                Past Medical History:  Diagnosis Date   Back pain    DDD (degenerative disc disease), cervical    Graves disease    History of blood clots 2014   Lumbar stenosis    Transverse myelitis (HCC)    Past Surgical History:  Procedure Laterality Date   OTHER SURGICAL HISTORY     Hysterectomy   Patient Active Problem List   Diagnosis Date Noted   Cellulitis 11/16/2021   Pulmonary embolism (HCC) 11/15/2021   Chest pain 08/04/2012   Pulmonary embolism (HCC) 08/01/2012   Hypokalemia 08/01/2012   Hypothyroidism 08/01/2012   Anemia 08/01/2012    PCP: Gerome Brunet, DO  REFERRING PROVIDER: Joya Stabs, DPM  REFERRING DIAG: M72.2 (ICD-10-CM) - Plantar fasciitis, bilateral  THERAPY DIAG:  Decreased activity tolerance  Balance problems  Rationale for Evaluation and Treatment: Rehabilitation  ONSET DATE: 5 months  SUBJECTIVE:   SUBJECTIVE STATEMENT: Feels ok today; no pain except usual stiffness in the morning. Has been doing home exercises.   Doing better with pain, improvements in pain severity when waking up in the morn. Has not been compliant with HEP ever since visiting daughter. Walking has been improving in terms of distance before pain.   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 Reports  Are you having pain? Yes: NPRS scale: 0/10 feet current Pain location: BL arches Pain description: sharp pain, worse in morn Aggravating factors: inactivity Relieving factors: medicine, moving it  PRECAUTIONS: None  RED FLAGS: None   WEIGHT BEARING RESTRICTIONS: No  FALLS:  Has patient fallen in last 6 months? No    PLOF: Independent  PATIENT GOALS:  decrease pain, be able to walk more   OBJECTIVE:  Note: Objective measures were completed at Evaluation unless otherwise noted.    PATIENT SURVEYS:  LEFS  Extreme difficulty/unable (0), Quite a bit of difficulty (1), Moderate difficulty (2), Little difficulty (3), No difficulty (4) Survey date:    Any of your usual work, housework or school activities   2. Usual hobbies, recreational or sporting activities   3. Getting into/out of the bath   4. Walking between rooms   5. Putting on socks/shoes   6. Squatting    7. Lifting an object, like a bag of groceries from the floor   8. Performing light activities around your home   9. Performing heavy activities around your home   10. Getting into/out of a car   11. Walking 2 blocks   12. Walking 1 mile   13. Going up/down 10 stairs (1 flight)   14. Standing for 1 hour   15.  sitting for 1 hour  16. Running on even ground   17. Running on uneven ground   18. Making sharp turns while running fast   19. Hopping    20. Rolling over in bed   Score total:  26   LEFS 33/80    POSTURE: rounded shoulders and forward head  PALPATION: TTP BL sole of feet and L dorsum (more pain on left side)  LOWER EXTREMITY ROM:  Active ROM Right eval Left eval  Hip flexion    Hip extension    Hip abduction    Hip adduction    Hip internal rotation    Hip external rotation    Knee flexion    Knee extension    Ankle dorsiflexion 0 4  Ankle plantarflexion 45 50  Ankle inversion    Ankle eversion     (Blank rows = not tested)  LOWER EXTREMITY MMT:  MMT  Right eval Left eval Rt/LT 03/17/24  Hip flexion 4- 4-   Hip extension     Hip abduction     Hip adduction     Hip internal rotation     Hip external rotation     Knee flexion 4- 4-   Knee extension 4 4   Ankle dorsiflexion 4- (p! Dorsum) 4-   Ankle plantarflexion 4 4   Ankle inversion   4 /4  Ankle eversion   4 /4   (Blank rows = not tested)  LOWER EXTREMITY SPECIAL TESTS:  +windlass L side (R side also presenting w/ sx of plantar fascitis)     Physical Performance Test: BERG BALANCE TEST Sitting to Standing: 4.      Stands without using hands and stabilize independently Standing Unsupported: 4.      Stands safely for 2 minutes Sitting Unsupported: 4.     Sits for 2 minutes independently Standing to Sitting: 4.     Sits safely with minimal use of hands Transfers: 4.     Transfers safely with minor use of hands Standing with eyes closed: 4.     Stands safely for 10 seconds  Standing with feet together: 3.     Stands for 1 minute with supervision Reaching forward with outstretched arm: 3.     Reaches forward 5 inches Retrieving object from the floor: 4.      Able to pick up easily and safely Turning to look behind: 3.     Looks behind one side only, other side less weight shift Turning 360 degrees: 2.     Able to turn slowly, but safely Place alternate foot on stool: 2.     4 steps without assistance/supervision Standing with one foot in front: 3.     Independent foot ahead for 30 seconds Standing on one foot: 1.     Holds <3 seconds  Total Score: 45/56  Score Key  <36 = High risk for falls (close to 100%)  46-51 = Moderate (>50%) fall risk  37-45 = Significant (>80%) fall risk  52-55 = Lower (>25%) fall risk  Score 26.7 - 39.6 = patient should use walker full-time  Score 44 - 46.5 = patient should use cane indoor  Score 47-49.6 = patient should use cane outdoor  Score 47.9 - 51.1 = no assistive device needed  TREATMENT DATE:  06/02/24 Therapeutic Exercise:  HEP reassessment and update Seated ankle circles CW/CCW 2x8 BL RTB, x8 GTB GTB clamshell 2x8x3s seated hip marches 2x8x3s BW GTB seated LAQ x4x3s ,x8x3s RTB    Neuromuscular Reeducation:  Calf raises x6x3s, x8x3s no UE support Counter top marches x8x3s SUE, x6x3s no UE  Standing RTB pallof press X8 BL with pertubations   05/26/24 Therapeutic Exercise: HEP reassessment and update Seated ankle circles CW/CCW 2x8 BL RTB Rtb clamshell x8x3s, GTB 2x6x3s Rtb seated hip marches x4x3s, x6x3s BW  Neuromuscular Reeducation Tandem marches S UE support x20 ft  Calf raises x4x3s B UE support, 2x6x3s SUE support, x4x3s no BUE  Standing Hip marches x6x3s S UE support  Standing RTB pallof press X8 BL       Neuromuscular Reeducation:  05/07/24 Therapeutic Activity HEP reassessment and review Functional reassessment and goal update  Neuromuscular reeducation Seated ankle circles CW/CCW 2x12 Rtb pf and inv 2x8x3s Rtb clamshell x8x3s Rtb seated hip marches x4x3s, x6x3s bodyweight Tandem marches S UE support x12 ft Calf raises x4x3s B UE support Standing Hip marches x6x3s S UE support    [facility] Adult PT Treatment:                                                DATE: 04/28/24  Neuromuscular re-ed: Tandem stance Tandem gait with counter assist  Narrow stance with head turns, nods, EC AIREX stand Updated HEP     [facility] Adult PT Treatment:                                                DATE: 04/10/24 Seated toe scrunches Toe extensions Assisted toe yoga Metatarsal mobs Seated Baps L3 taps and circles, CW/CCW bilateral  Standing calf raises 8 x 2 , 1 set with toe raise  Standing rocker board , blue A/P progressing to without UE Tandem stance 20-30 sec each  Calf stretch 10 sec x 3 each  Updated HEP    [facility] Adult PT Treatment:                                                 DATE: 04/07/24 Seated toe scrunches Toe extensions Assisted toe yoga Metatarsal mobs, manually and self performed Seated Baps L3 taps and circles, CW/CCW bilateral  Standing calf raises 8 x 2 , 1 set with toe raise  Tandem stance 10-15 sec  each  Calf stretch 10 sec x 3 each  Plan to update HEP next visit             PATIENT EDUCATION:  Education details: HEP Person educated: Patient Education method: Programmer, Multimedia, Facilities Manager, Verbal cues, and Handouts Education comprehension: verbalized understanding and returned demonstration  HOME EXERCISE PROGRAM: Access Code: [email or web address] URL: [email or web address] Date: 05/26/2024 Prepared by: Washington Scot  Exercises - Heel Raises with Counter Support  - 2 x daily - 5 x weekly - 2 sets - 8 reps - 3 hold - Standing March with Unilateral Counter Support  - 2 x daily - 5 x weekly - 2 sets - 8 reps -  3 hold - Seated Hip Abduction with Resistance  - 2 x daily - 5 x weekly - 2 sets - 8 reps - 3 hold - Walking with Counter Support  - 2 x daily - 5 x weekly - 2 sets - 8 reps - 3 hold    ASSESSMENT:  CLINICAL IMPRESSION:  Patient tolerated treatment with no increases in pain with progressions in BL hip and BL LE balance. Current deficits include: static and dynamic balance, functional strength. As a result, patient would continue to benefit from skilled PT to address said deficits via plan below.     EVAL:Patient is a 64 y.o. female who was seen today for physical therapy evaluation and treatment for BL plantar fascitis. Current deficits are: debilitating pain, functional lower body activity tolerance, Lower body functional strength, and balance. Pt would benefit from skilled PT to address said impairments via plan below.   OBJECTIVE IMPAIRMENTS: decreased balance, decreased strength, and pain.   REHAB POTENTIAL: Fair    CLINICAL DECISION MAKING: Evolving/moderate complexity  EVALUATION COMPLEXITY:  Moderate   GOALS: Goals reviewed with patient? No  SHORT TERM GOALS: Target date: 03/21/2024   Patient will demonstrate 75% HEP compliance to show independence with self-management of condition  Baseline: 0% Goal status: MET   2.  Patient will decrease worst pain to 6/10 at most to improve ADL completion and overall QOL  Baseline: 8/10 04/07/24: worst 4-5/10 in the mornings  Goal status: MET    LONG TERM GOALS: Target date: 07/02/24  Patient will demonstrate a 5 improvement in LEFS to show improvements in ADL completion and overall QOL   Baseline: 26 04/28/24: 33 Goal status: MET   2.  Patient will decrease worst pain to 4/10 at most to improve ADL completion and overall QOL  Baseline: 8 04/28/24: 4-5/10 in the morning only  Goal status: ONGOING   3.  Patient will be able to walk at least 75% capacity to demonstrate improvements in walking tolerance  Baseline: 25% 04/28/24: 55%  Goal status: ONGOING  4. Patient will Improve BERG to at least 47 to demonstrate improvements in functional balance and overall QOL  Baseline: 45  Goal status: initial  5. Pt will be able to walk for 50 feet without cane to demonstrate improvements in BL LE activity tolerance, balance, and overall QOL.  Baseline: unable  Goal status: initial     PLAN:  PT FREQUENCY: 1-2x/week  PT DURATION: 8 weeks  PLANNED INTERVENTIONS: 97110-Therapeutic exercises, 97530- Therapeutic activity, [id number]- Neuromuscular re-education, 97535- Self Care, [id number]- Manual therapy, [phone]- Gait training, Patient/Family education, and Balance training  PLAN FOR NEXT SESSION: HEP assessment and progression, symptom modulation, and loading (isolated/functional). Manual therapy, aerobic, gait, and NME training as needed. Work on functional strengthening of BL LE and progressive balancing with end goal of reducing dependence of walker/cane.   [place] Odessia Scot  PT, DPT     "

## 2024-06-09 ENCOUNTER — Ambulatory Visit

## 2024-06-09 DIAGNOSIS — R6889 Other general symptoms and signs: Secondary | ICD-10-CM

## 2024-06-09 DIAGNOSIS — R2689 Other abnormalities of gait and mobility: Secondary | ICD-10-CM

## 2024-06-09 NOTE — Therapy (Signed)
 " OUTPATIENT PHYSICAL THERAPY LOWER EXTREMITY TREATMENT  Progress Note Reporting Period 10/10 to 12/17  See note below for Objective Data and Assessment of Progress/Goals.    Patient Name: Cynthia Villegas MRN: [id number] DOB:May 03, 1961, 64 y.o., female Today's Date: 06/09/2024  END OF SESSION:  PT End of Session - 06/09/24 1105     Visit Number 12    Number of Visits 16    Date for Recertification  07/02/24    Authorization Type [employer]    PT Start Time 1105   pt late   PT Stop Time 1145    PT Time Calculation (min) 40 min    Activity Tolerance Patient tolerated treatment well                 Past Medical History:  Diagnosis Date   Back pain    DDD (degenerative disc disease), cervical    Graves disease    History of blood clots 2014   Lumbar stenosis    Transverse myelitis (HCC)    Past Surgical History:  Procedure Laterality Date   OTHER SURGICAL HISTORY     Hysterectomy   Patient Active Problem List   Diagnosis Date Noted   Cellulitis 11/16/2021   Pulmonary embolism (HCC) 11/15/2021   Chest pain 08/04/2012   Pulmonary embolism (HCC) 08/01/2012   Hypokalemia 08/01/2012   Hypothyroidism 08/01/2012   Anemia 08/01/2012    PCP: Gerome Brunet, DO  REFERRING PROVIDER: Joya Stabs, DPM  REFERRING DIAG: M72.2 (ICD-10-CM) - Plantar fasciitis, bilateral  THERAPY DIAG:  Decreased activity tolerance  Balance problems  Rationale for Evaluation and Treatment: Rehabilitation  ONSET DATE: 5 months  SUBJECTIVE:   SUBJECTIVE STATEMENT: Feels ok today; no pain except usual stiffness in the morning. Has been doing home exercises. Able to go to [employer] exercise classes and doing well with that. 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 Reports  Are you having pain? Yes: NPRS scale: 0/10 feet current Pain  location: BL arches Pain description: sharp pain, worse in morn Aggravating factors: inactivity Relieving factors: medicine, moving it  PRECAUTIONS: None  RED FLAGS: None   WEIGHT BEARING RESTRICTIONS: No  FALLS:  Has patient fallen in last 6 months? No    PLOF: Independent  PATIENT GOALS:  decrease pain, be able to walk more   OBJECTIVE:  Note: Objective measures were completed at Evaluation unless otherwise noted.    PATIENT SURVEYS:  LEFS  Extreme difficulty/unable (0), Quite a bit of difficulty (1), Moderate difficulty (2), Little difficulty (3), No difficulty (4) Survey date:    Any of your usual work, housework or school activities   2. Usual hobbies, recreational or sporting activities   3. Getting into/out of the bath   4. Walking between rooms   5. Putting on socks/shoes   6. Squatting    7. Lifting an object, like a bag of groceries from the floor   8. Performing light activities around your home   9. Performing heavy activities around your home   10. Getting into/out of a car   11. Walking 2 blocks   12. Walking 1 mile   13. Going up/down 10 stairs (1 flight)   14. Standing for 1 hour   15.  sitting for 1 hour   16. Running on even ground   17. Running on uneven  ground   18. Making sharp turns while running fast   19. Hopping    20. Rolling over in bed   Score total:  26   LEFS 33/80    POSTURE: rounded shoulders and forward head  PALPATION: TTP BL sole of feet and L dorsum (more pain on left side)  LOWER EXTREMITY ROM:  Active ROM Right eval Left eval  Hip flexion    Hip extension    Hip abduction    Hip adduction    Hip internal rotation    Hip external rotation    Knee flexion    Knee extension    Ankle dorsiflexion 0 4  Ankle plantarflexion 45 50  Ankle inversion    Ankle eversion     (Blank rows = not tested)  LOWER EXTREMITY MMT:  MMT Right eval Left eval Rt/LT 03/17/24  Hip flexion 4- 4-   Hip extension     Hip  abduction     Hip adduction     Hip internal rotation     Hip external rotation     Knee flexion 4- 4-   Knee extension 4 4   Ankle dorsiflexion 4- (p! Dorsum) 4-   Ankle plantarflexion 4 4   Ankle inversion   4 /4  Ankle eversion   4 /4   (Blank rows = not tested)  LOWER EXTREMITY SPECIAL TESTS:  +windlass L side (R side also presenting w/ sx of plantar fascitis)     Physical Performance Test: BERG BALANCE TEST Sitting to Standing: 4.      Stands without using hands and stabilize independently Standing Unsupported: 4.      Stands safely for 2 minutes Sitting Unsupported: 4.     Sits for 2 minutes independently Standing to Sitting: 4.     Sits safely with minimal use of hands Transfers: 4.     Transfers safely with minor use of hands Standing with eyes closed: 4.     Stands safely for 10 seconds  Standing with feet together: 3.     Stands for 1 minute with supervision Reaching forward with outstretched arm: 3.     Reaches forward 5 inches Retrieving object from the floor: 4.      Able to pick up easily and safely Turning to look behind: 3.     Looks behind one side only, other side less weight shift Turning 360 degrees: 2.     Able to turn slowly, but safely Place alternate foot on stool: 2.     4 steps without assistance/supervision Standing with one foot in front: 3.     Independent foot ahead for 30 seconds Standing on one foot: 1.     Holds <3 seconds  Total Score: 45/56  Score Key  <36 = High risk for falls (close to 100%)  46-51 = Moderate (>50%) fall risk  37-45 = Significant (>80%) fall risk  52-55 = Lower (>25%) fall risk  Score 26.7 - 39.6 = patient should use walker full-time  Score 44 - 46.5 = patient should use cane indoor  Score 47-49.6 = patient should use cane outdoor  Score 47.9 - 51.1 = no assistive device needed  TREATMENT  DATE:  06/09/24 Therapeutic Exercise:  HEP reassessment and update Seated ankle circles CW/CCW 2x8 GTB GTB clamshell 2x8x3s  seated hip marches x8x3s BW,YTB 2x4x3s   Neuromuscular Reeducation:  Calf raises x8x3s no UE support Counter top marches x8x3s with occasional SUE Step ups 8 in x6 BL with SUE  Did not do: Standing RTB pallof press X8 BL with pertubations     06/02/24 Therapeutic Exercise:  HEP reassessment and update Seated ankle circles CW/CCW 2x8 GTB GTB clamshell 2x8x3s  seated hip marches x8x3s BW    Neuromuscular Reeducation:  Calf raises x8x3s no UE support Counter top marches x8x3s with occasional SUE Standing RTB pallof press X8 BL with pertubations   05/26/24 Therapeutic Exercise: HEP reassessment and update Seated ankle circles CW/CCW 2x8 BL RTB Rtb clamshell x8x3s, GTB 2x6x3s Rtb seated hip marches x4x3s, x6x3s BW  Neuromuscular Reeducation Tandem marches S UE support x20 ft  Calf raises x4x3s B UE support, 2x6x3s SUE support, x4x3s no BUE  Standing Hip marches x6x3s S UE support  Standing RTB pallof press X8 BL       Neuromuscular Reeducation:  05/07/24 Therapeutic Activity HEP reassessment and review Functional reassessment and goal update  Neuromuscular reeducation Seated ankle circles CW/CCW 2x12 Rtb pf and inv 2x8x3s Rtb clamshell x8x3s Rtb seated hip marches x4x3s, x6x3s bodyweight Tandem marches S UE support x12 ft Calf raises x4x3s B UE support Standing Hip marches x6x3s S UE support    [facility] Adult PT Treatment:                                                DATE: 04/28/24  Neuromuscular re-ed: Tandem stance Tandem gait with counter assist  Narrow stance with head turns, nods, EC AIREX stand Updated HEP     [facility] Adult PT Treatment:                                                DATE: 04/10/24 Seated toe scrunches Toe extensions Assisted toe yoga Metatarsal mobs Seated Baps L3 taps and circles, CW/CCW bilateral   Standing calf raises 8 x 2 , 1 set with toe raise  Standing rocker board , blue A/P progressing to without UE Tandem stance 20-30 sec each  Calf stretch 10 sec x 3 each  Updated HEP    [facility] Adult PT Treatment:                                                DATE: 04/07/24 Seated toe scrunches Toe extensions Assisted toe yoga Metatarsal mobs, manually and self performed Seated Baps L3 taps and circles, CW/CCW bilateral  Standing calf raises 8 x 2 , 1 set with toe raise  Tandem stance 10-15 sec  each  Calf stretch 10 sec x 3 each  Plan to update HEP next visit             PATIENT EDUCATION:  Education details: HEP Person educated: Patient Education method: Programmer, Multimedia, Facilities Manager, Verbal cues, and Handouts Education comprehension: verbalized understanding and returned demonstration  HOME EXERCISE PROGRAM: Access Code: [email or web address]  URL: [email or web address] Date: 05/26/2024 Prepared by: Washington Scot  Exercises - Heel Raises with Counter Support  - 2 x daily - 5 x weekly - 2 sets - 8 reps - 3 hold - Standing March with Unilateral Counter Support  - 2 x daily - 5 x weekly - 2 sets - 8 reps - 3 hold - Seated Hip Abduction with Resistance  - 2 x daily - 5 x weekly - 2 sets - 8 reps - 3 hold - Walking with Counter Support  - 2 x daily - 5 x weekly - 2 sets - 8 reps - 3 hold    ASSESSMENT:  CLINICAL IMPRESSION:  Patient tolerated treatment with no increases in pain with progressions in BL hip and BL LE balance. Current deficits include: static and dynamic balance, functional strength. As a result, patient would continue to benefit from skilled PT to address said deficits via plan below.     EVAL:Patient is a 64 y.o. female who was seen today for physical therapy evaluation and treatment for BL plantar fascitis. Current deficits are: debilitating pain, functional lower body activity tolerance, Lower body functional strength, and balance. Pt would benefit  from skilled PT to address said impairments via plan below.   OBJECTIVE IMPAIRMENTS: decreased balance, decreased strength, and pain.   REHAB POTENTIAL: Fair    CLINICAL DECISION MAKING: Evolving/moderate complexity  EVALUATION COMPLEXITY: Moderate   GOALS: Goals reviewed with patient? No  SHORT TERM GOALS: Target date: 03/21/2024   Patient will demonstrate 75% HEP compliance to show independence with self-management of condition  Baseline: 0% Goal status: MET   2.  Patient will decrease worst pain to 6/10 at most to improve ADL completion and overall QOL  Baseline: 8/10 04/07/24: worst 4-5/10 in the mornings  Goal status: MET    LONG TERM GOALS: Target date: 07/02/24  Patient will demonstrate a 5 improvement in LEFS to show improvements in ADL completion and overall QOL   Baseline: 26 04/28/24: 33 Goal status: MET   2.  Patient will decrease worst pain to 4/10 at most to improve ADL completion and overall QOL  Baseline: 8 04/28/24: 4-5/10 in the morning only  Goal status: ONGOING   3.  Patient will be able to walk at least 75% capacity to demonstrate improvements in walking tolerance  Baseline: 25% 04/28/24: 55%  Goal status: ONGOING  4. Patient will Improve BERG to at least 47 to demonstrate improvements in functional balance and overall QOL  Baseline: 45  Goal status: initial  5. Pt will be able to walk for 50 feet without cane to demonstrate improvements in BL LE activity tolerance, balance, and overall QOL.  Baseline: unable  Goal status: initial     PLAN:  PT FREQUENCY: 1-2x/week  PT DURATION: 8 weeks  PLANNED INTERVENTIONS: 97110-Therapeutic exercises, 97530- Therapeutic activity, [id number]- Neuromuscular re-education, 97535- Self Care, [id number]- Manual therapy, [phone]- Gait training, Patient/Family education, and Balance training  PLAN FOR NEXT SESSION: HEP assessment and progression, symptom modulation, and loading (isolated/functional). Manual  therapy, aerobic, gait, and NME training as needed. Work on functional strengthening of BL LE and progressive balancing with end goal of reducing dependence of walker/cane.   [place] Odessia Scot  PT, DPT     "

## 2024-06-16 ENCOUNTER — Ambulatory Visit: Admitting: Physical Therapy

## 2024-06-18 ENCOUNTER — Ambulatory Visit

## 2024-06-23 ENCOUNTER — Ambulatory Visit

## 2024-06-30 ENCOUNTER — Ambulatory Visit

## 2024-07-11 ENCOUNTER — Inpatient Hospital Stay

## 2024-07-11 ENCOUNTER — Inpatient Hospital Stay: Admitting: Hematology and Oncology
